# Patient Record
Sex: Male | Born: 1980 | Race: Black or African American | Hispanic: No | Marital: Single | Smoking: Current every day smoker
Health system: Southern US, Community
[De-identification: ages and names within clinical notes are randomized; demographics above are authoritative.]

## PROBLEM LIST (undated history)

## (undated) DIAGNOSIS — F329 Major depressive disorder, single episode, unspecified: Secondary | ICD-10-CM

## (undated) DIAGNOSIS — F32A Depression, unspecified: Secondary | ICD-10-CM

## (undated) DIAGNOSIS — F419 Anxiety disorder, unspecified: Secondary | ICD-10-CM

---

## 1998-02-02 ENCOUNTER — Emergency Department (HOSPITAL_COMMUNITY): Admission: EM | Admit: 1998-02-02 | Discharge: 1998-02-02 | Payer: Self-pay | Admitting: Emergency Medicine

## 2001-03-31 ENCOUNTER — Emergency Department (HOSPITAL_COMMUNITY): Admission: EM | Admit: 2001-03-31 | Discharge: 2001-03-31 | Payer: Self-pay | Admitting: Emergency Medicine

## 2001-05-07 ENCOUNTER — Emergency Department (HOSPITAL_COMMUNITY): Admission: EM | Admit: 2001-05-07 | Discharge: 2001-05-07 | Payer: Self-pay | Admitting: Emergency Medicine

## 2010-02-08 ENCOUNTER — Ambulatory Visit (HOSPITAL_BASED_OUTPATIENT_CLINIC_OR_DEPARTMENT_OTHER): Admission: RE | Admit: 2010-02-08 | Discharge: 2010-02-08 | Payer: Self-pay | Admitting: Orthopedic Surgery

## 2010-03-20 ENCOUNTER — Encounter: Admission: RE | Admit: 2010-03-20 | Discharge: 2010-06-18 | Payer: Self-pay | Admitting: Orthopedic Surgery

## 2010-09-12 ENCOUNTER — Emergency Department (HOSPITAL_COMMUNITY): Admission: EM | Admit: 2010-09-12 | Discharge: 2010-02-01 | Payer: Self-pay | Admitting: Emergency Medicine

## 2010-12-24 LAB — POCT HEMOGLOBIN-HEMACUE: Hemoglobin: 14.4 g/dL (ref 13.0–17.0)

## 2011-01-21 IMAGING — CR DG HAND COMPLETE 3+V*R*
4 series · 4 of 4 positions shown · non-contrast
Comparison: None

CLINICAL DATA: Pain, struck a pole, swelling

RIGHT HAND - COMPLETE 3+ VIEW

[x hand pa right]
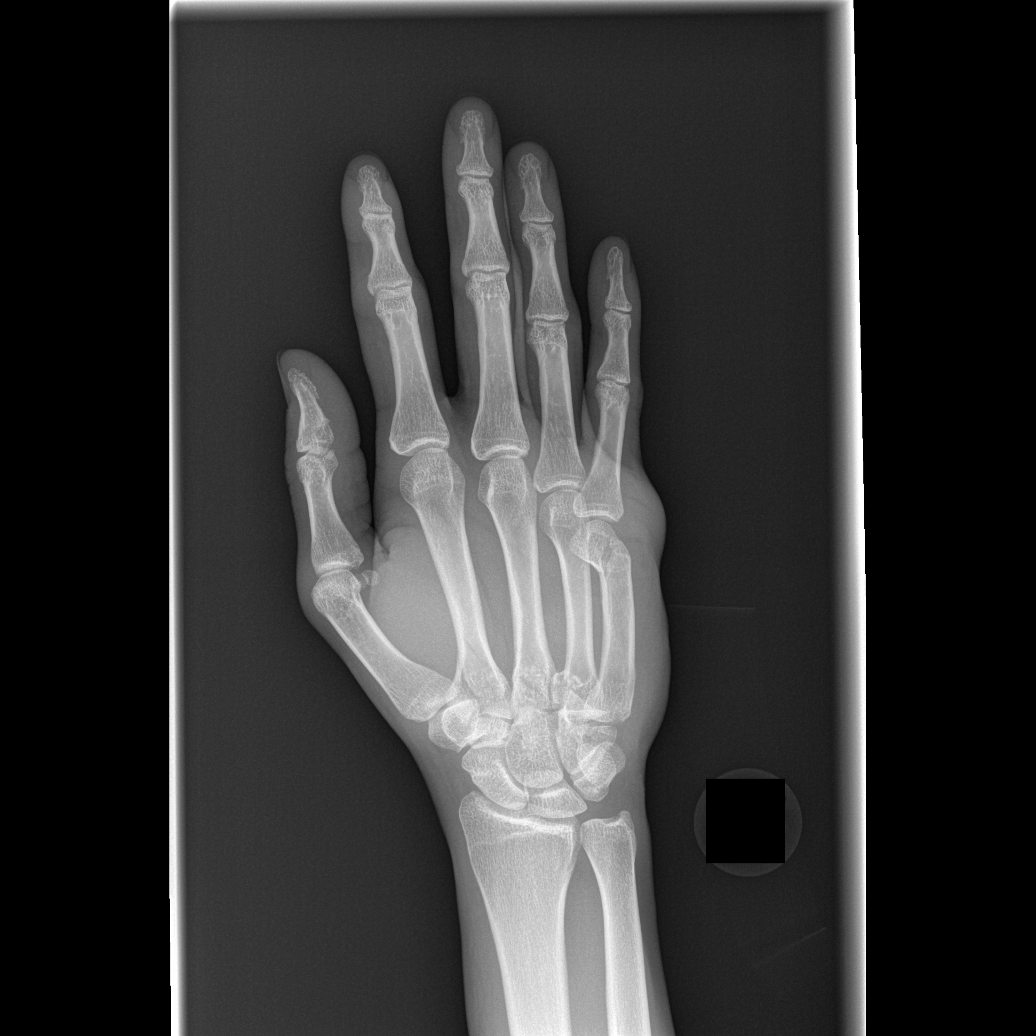

[x hand oblique right]
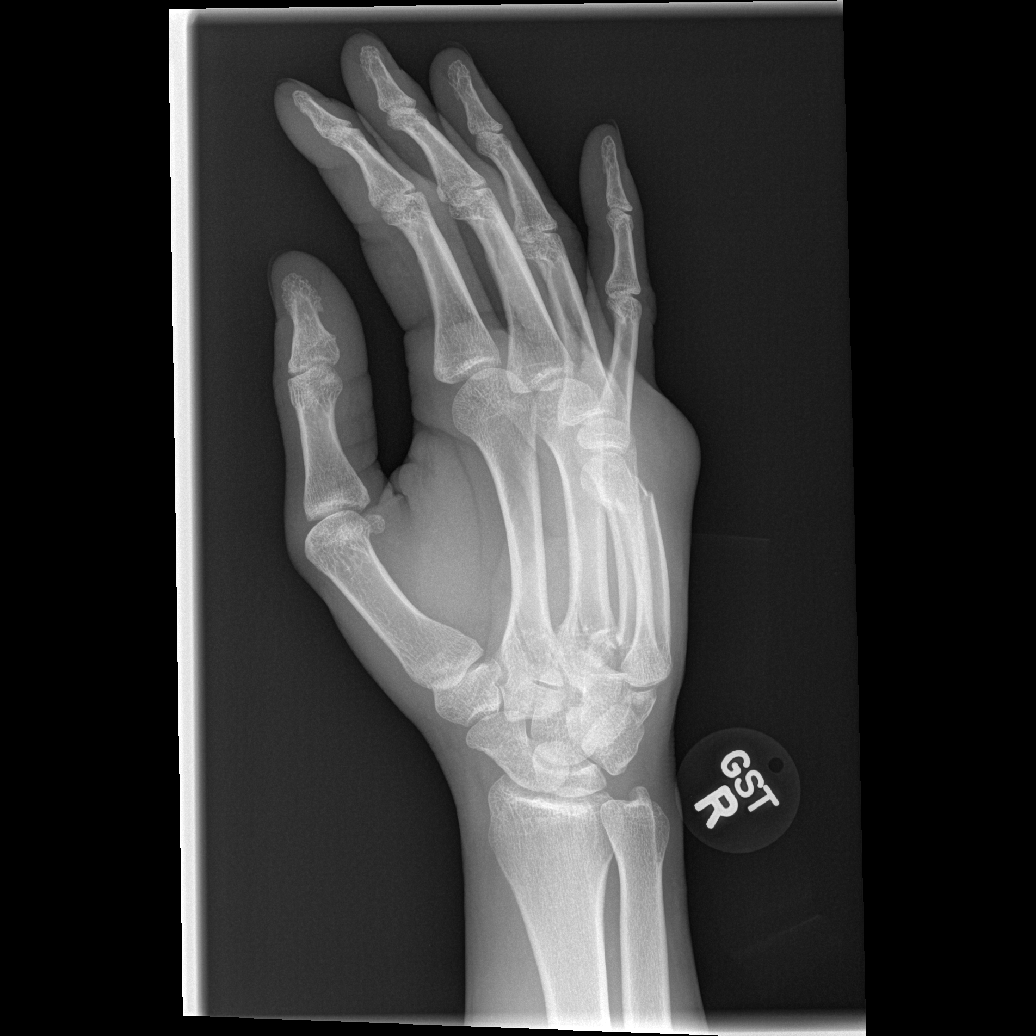

[x hand lat right (1 of 2)]
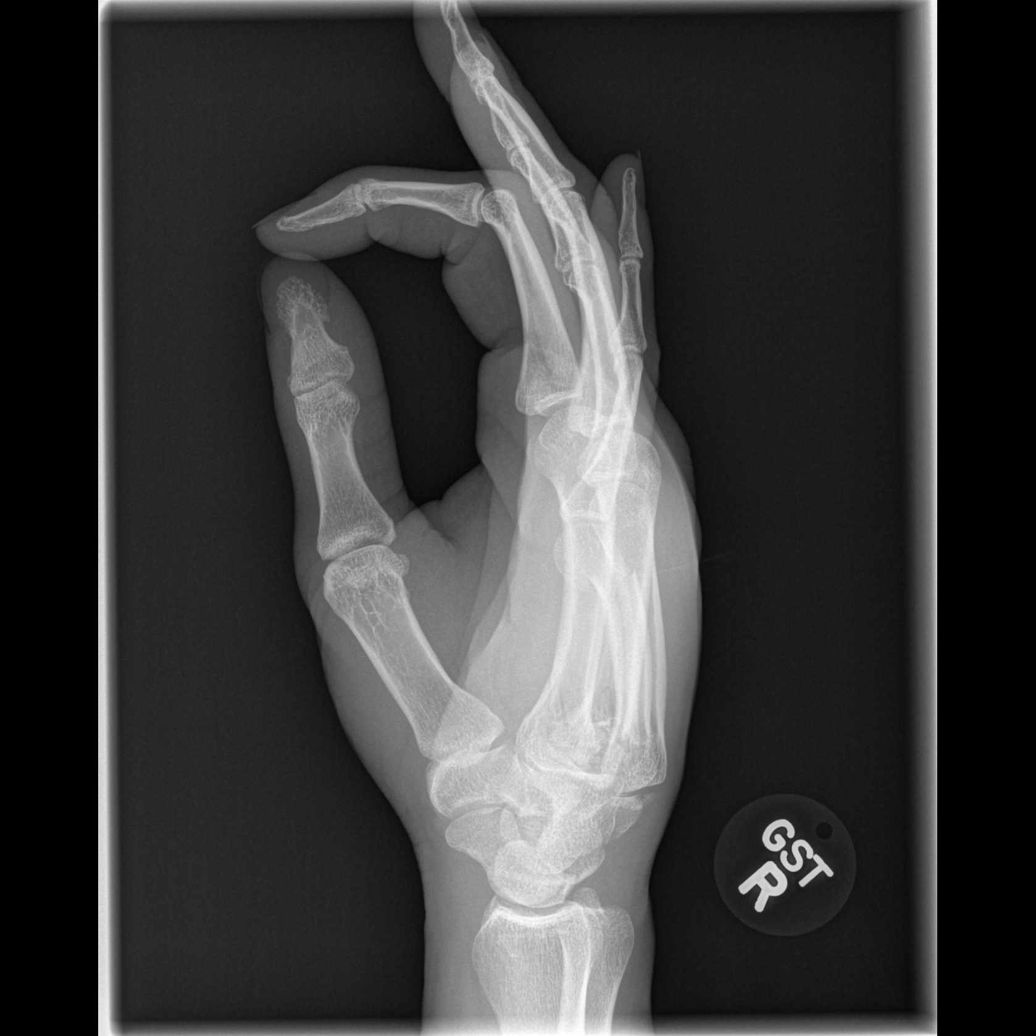

[x hand lat right (2 of 2)]
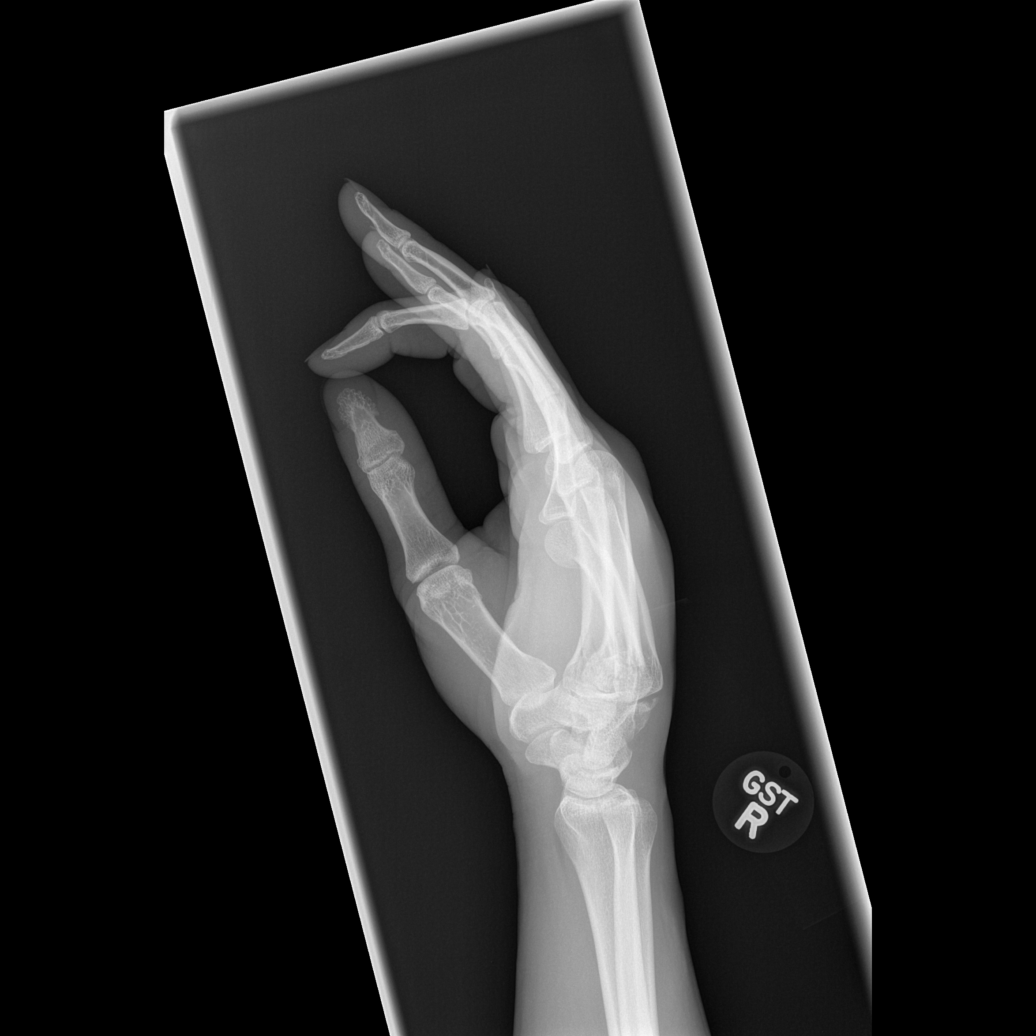

[4 of 4 positions shown; findings below may reference images not displayed]

FINDINGS: Soft tissue swelling right hand, greatest at dorsal and ulnar
margins.
Angulated distal right fifth metacarpal fracture.
Fractures identified at bases of second, third and fourth
metacarpals.
Fractures of the bases of the second and third metacarpals appear
oblique, likely intra-articular, and are not significantly
displaced.
Intra-articular fracture of base of fourth metacarpal with
significant dorsal displacement.
Displaced fracture identified at dorsal ulnar margin of distal
hamate, displaced dorsal and proximal with articular surface
offset.
Remaining joint spaces preserved.
No phalangeal fractures identified.
IMPRESSION: Angulated distal fifth metacarpal fracture.
Essentially nondisplaced fractures at the bases of the second and
third metacarpals, suspect intra-articular at the CMC joints.
Intra-articular fracture at base of the fourth metacarpal with
dorsal displacement.
Dorsal hamate fracture with dorsal displacement of hamate fracture
fragment and fifth CMC joint.

## 2018-01-20 ENCOUNTER — Emergency Department (HOSPITAL_COMMUNITY)
Admission: EM | Admit: 2018-01-20 | Discharge: 2018-01-21 | Disposition: A | Discharge status: Home / Self Care | Payer: Self-pay | Attending: Emergency Medicine | Admitting: Emergency Medicine

## 2018-01-20 ENCOUNTER — Emergency Department (HOSPITAL_COMMUNITY): Payer: Self-pay

## 2018-01-20 ENCOUNTER — Encounter (HOSPITAL_COMMUNITY): Payer: Self-pay

## 2018-01-20 ENCOUNTER — Other Ambulatory Visit: Payer: Self-pay

## 2018-01-20 DIAGNOSIS — R0789 Other chest pain: Secondary | ICD-10-CM | POA: Insufficient documentation

## 2018-01-20 LAB — BASIC METABOLIC PANEL
ANION GAP: 9 (ref 5–15)
BUN: 7 mg/dL (ref 6–20)
CALCIUM: 9.5 mg/dL (ref 8.9–10.3)
CO2: 26 mmol/L (ref 22–32)
Chloride: 101 mmol/L (ref 101–111)
Creatinine, Ser: 1.1 mg/dL (ref 0.61–1.24)
GFR calc non Af Amer: >60 mL/min (ref >60)
Glucose, Bld: 96 mg/dL (ref 65–99)
Potassium: 3.9 mmol/L (ref 3.5–5.1)
SODIUM: 136 mmol/L (ref 135–145)

## 2018-01-20 LAB — CBC
HCT: 43.8 % (ref 39.0–52.0)
HEMOGLOBIN: 14.8 g/dL (ref 13.0–17.0)
MCH: 31.8 pg (ref 26.0–34.0)
MCHC: 33.8 g/dL (ref 30.0–36.0)
MCV: 94.2 fL (ref 78.0–100.0)
Platelets: 271 10*3/uL (ref 150–400)
RBC: 4.65 MIL/uL (ref 4.22–5.81)
RDW: 13.8 % (ref 11.5–15.5)
WBC: 4.8 10*3/uL (ref 4.0–10.5)

## 2018-01-20 LAB — I-STAT TROPONIN, ED
TROPONIN I, POC: 0 ng/mL (ref 0.00–0.08)
Troponin i, poc: 0.01 ng/mL (ref 0.00–0.08)

## 2018-01-20 NOTE — ED Provider Notes (Signed)
[facility] EMERGENCY DEPARTMENT Provider Note   CSN: [id number] Arrival date & time: 01/20/18  1926     History   Chief Complaint Chief Complaint  Patient presents with  . Chest Pain    HPI Austin Doyle is a 37 y.o. male presents today for evaluation of intermittent chest pain for 2 years.  Patient states that he went to [facility] for remedication of his bipolar disorder today and mentioned his chest pain so they sent him here for medical clearance.  He states that he experienced chest pain daily "when I get stressed ".  The pain is nonexertional and nonpleuritic.  Pain is almost always to the left side of the chest and feels like "something sharp and stabbing behind my heart".  He notes shortness of breath with this sometimes but states that it is typically related to when he feels stressed.  He denies diaphoresis, nausea, vomiting, or lightheadedness.  He is a smoker of approximately half a pack of cigarettes daily.  He denies fevers, chills.  No recent travel or surgeries, no hemoptysis, no prior history of DVT or PE although he does tell me that he has had lower extremity edema in the past which required Lasix.  He is not on this medication at this time.  He notes occasional alcohol use and does note cocaine and marijuana use.  No other recreational drug use.  Denies excessive alcohol intake. The history is provided by the patient.    History reviewed. No pertinent past medical history.  There are no active problems to display for this patient.   History reviewed. No pertinent surgical history.      Home Medications    Prior to Admission medications   Not on File    Family History History reviewed. No pertinent family history.  Social History Social History   Tobacco Use  . Smoking status: Never Smoker  Substance Use Topics  . Alcohol use: Never    Frequency: Never  . Drug use: Never     Allergies   Patient has no known allergies.   Review  of Systems Review of Systems  Constitutional: Negative for chills, diaphoresis, fatigue and fever.  Respiratory: Positive for shortness of breath.   Cardiovascular: Positive for chest pain. Negative for palpitations and leg swelling.  Gastrointestinal: Negative for abdominal pain, diarrhea, nausea and vomiting.  All other systems reviewed and are negative.    Physical Exam Updated Vital Signs BP 130/80 (BP Location: Right Arm)   Pulse 84   Temp 98.9 F (37.2 C) (Oral)   Resp 14   SpO2 99%   Physical Exam  Constitutional: He appears well-developed and well-nourished.  Non-toxic appearance. He does not appear ill. No distress.  HENT:  Head: Normocephalic and atraumatic.  Eyes: Conjunctivae are normal. Right eye exhibits no discharge. Left eye exhibits no discharge.  Neck: Normal range of motion. Neck supple. No JVD present. No tracheal deviation present.  Cardiovascular: Normal rate and regular rhythm. Exam reveals no gallop, no S3 and no S4.  No murmur heard. Pulses:      Carotid pulses are 2+ on the right side, and 2+ on the left side.      Radial pulses are 2+ on the right side, and 2+ on the left side.       Dorsalis pedis pulses are 2+ on the right side, and 2+ on the left side.       Posterior tibial pulses are 2+ on the right side,  and 2+ on the left side.  No lower extremity edema, Homans sign absent bilaterally, no palpable cords  Pulmonary/Chest: Effort normal and breath sounds normal. No accessory muscle usage or stridor. No tachypnea. No respiratory distress.  Equal rise and fall of chest, no increased work of breathing, speaking in full sentences without difficulty.  Mild anterior bilateral chest wall pain with no deformity, crepitus, ecchymosis, or flail segment noted.  Abdominal: Soft. Bowel sounds are normal. He exhibits no distension.  Musculoskeletal: Normal range of motion. He exhibits no edema.       Right lower leg: Normal. He exhibits no tenderness and no  edema.       Left lower leg: Normal. He exhibits no tenderness and no edema.  Neurological: He is alert.  Skin: Skin is warm and dry. No erythema.  Psychiatric: He has a normal mood and affect. His behavior is normal.  Nursing note and vitals reviewed.    ED Treatments / Results  Labs (all labs ordered are listed, but only abnormal results are displayed) Labs Reviewed  BASIC METABOLIC PANEL  CBC  I-STAT TROPONIN, ED  I-STAT TROPONIN, ED    EKG EKG Interpretation  Date/Time:  Wednesday January 20 2018 19:30:48 EDT Ventricular Rate:  55 PR Interval:  140 QRS Duration: 84 QT Interval:  422 QTC Calculation: 403 R Axis:   86 Text Interpretation:  Sinus bradycardia Otherwise normal ECG No old tracing to compare Confirmed by Linwood Dibbles [phone]) on 01/21/2018 12:48:51 PM   Radiology Dg Chest 2 View  Result Date: 01/20/2018 CLINICAL DATA:  Chest pain EXAM: CHEST - 2 VIEW COMPARISON:  None. FINDINGS: The heart size and mediastinal contours are within normal limits. Both lungs are clear. The visualized skeletal structures are unremarkable. IMPRESSION: No active cardiopulmonary disease. Electronically Signed   By: Marlan Palau M.D.   On: 01/20/2018 20:18    Procedures Procedures (including critical care time)  Medications Ordered in ED Medications - No data to display   Initial Impression / Assessment and Plan / ED Course  I have reviewed the triage vital signs and the nursing notes.  Pertinent labs & imaging results that were available during my care of the patient were reviewed by me and considered in my medical decision making (see chart for details).     Patient presents for medical clearance of chest pain from [facility].  He is afebrile, vital signs are stable.  Chest pain only occurs when he feels stressed and has been intermittent for 2 years.  History and physical examination does not suggest that symptoms are cardiac in etiology.  EKG shows sinus bradycardia but otherwise  normal with no evidence of ST segment abnormality or ischemia.  Serial troponins are negative and I doubt ACS or MI.  Chest x-ray shows no active cardiopulmonary disease and lungs are clear to auscultation bilaterally.  He has no risk factors for PE.  No evidence of pericarditis, myocarditis, bronchitis, or pneumonia.  Remainder of lab work reviewed by me is entirely unremarkable.  He is medically cleared for reevaluation at [facility].  He denies SI or HI to me.  He is safe for discharge and states he will follow-up with [facility] immediately at this time.  Discussed strict ED return precautions.  Patient verbalized understanding of and agreement with this plan.  Final Clinical Impressions(s) / ED Diagnoses   Final diagnoses:  Atypical chest pain    ED Discharge Orders    None       Edom, [place]  A, PA-C 01/21/18 1407    Linwood DibblesKnapp, Jon, MD 01/23/18 [phone]

## 2018-01-20 NOTE — Discharge Instructions (Signed)
[facility]  Go there as soon as possible.  Return to the ED for any concerning signs or symptoms develop.

## 2018-01-20 NOTE — ED Triage Notes (Signed)
Per [facility], pt complains of CP x 2 years. VSS. [facility] and they made him come here to get medically cleared first.

## 2018-06-14 ENCOUNTER — Other Ambulatory Visit: Payer: Self-pay

## 2018-06-14 ENCOUNTER — Emergency Department (HOSPITAL_COMMUNITY)
Admission: EM | Admit: 2018-06-14 | Discharge: 2018-06-15 | Disposition: A | Discharge status: Other Acute Inpatient Hospital | Payer: Self-pay | Attending: Emergency Medicine | Admitting: Emergency Medicine

## 2018-06-14 ENCOUNTER — Encounter (HOSPITAL_COMMUNITY): Payer: Self-pay | Admitting: *Deleted

## 2018-06-14 DIAGNOSIS — R51 Headache: Secondary | ICD-10-CM | POA: Insufficient documentation

## 2018-06-14 DIAGNOSIS — F322 Major depressive disorder, single episode, severe without psychotic features: Secondary | ICD-10-CM | POA: Insufficient documentation

## 2018-06-14 DIAGNOSIS — F319 Bipolar disorder, unspecified: Secondary | ICD-10-CM | POA: Insufficient documentation

## 2018-06-14 DIAGNOSIS — F332 Major depressive disorder, recurrent severe without psychotic features: Secondary | ICD-10-CM

## 2018-06-14 DIAGNOSIS — R45851 Suicidal ideations: Secondary | ICD-10-CM | POA: Insufficient documentation

## 2018-06-14 DIAGNOSIS — R519 Headache, unspecified: Secondary | ICD-10-CM

## 2018-06-14 LAB — COMPREHENSIVE METABOLIC PANEL
ALT: 20 U/L (ref 0–44)
ANION GAP: 6 (ref 5–15)
AST: 26 U/L (ref 15–41)
Albumin: 3.7 g/dL (ref 3.5–5.0)
Alkaline Phosphatase: 48 U/L (ref 38–126)
BILIRUBIN TOTAL: 0.9 mg/dL (ref 0.3–1.2)
BUN: 8 mg/dL (ref 6–20)
CHLORIDE: 103 mmol/L (ref 98–111)
CO2: 30 mmol/L (ref 22–32)
Calcium: 9.4 mg/dL (ref 8.9–10.3)
Creatinine, Ser: 1.11 mg/dL (ref 0.61–1.24)
Glucose, Bld: 122 mg/dL — ABNORMAL HIGH (ref 70–99)
Potassium: 4.7 mmol/L (ref 3.5–5.1)
SODIUM: 139 mmol/L (ref 135–145)
Total Protein: 7 g/dL (ref 6.5–8.1)

## 2018-06-14 LAB — CBC
HCT: 45 % (ref 39.0–52.0)
HEMOGLOBIN: 14.9 g/dL (ref 13.0–17.0)
MCH: 31.2 pg (ref 26.0–34.0)
MCHC: 33.1 g/dL (ref 30.0–36.0)
MCV: 94.3 fL (ref 78.0–100.0)
Platelets: 276 10*3/uL (ref 150–400)
RBC: 4.77 MIL/uL (ref 4.22–5.81)
RDW: 12.8 % (ref 11.5–15.5)
WBC: 3.4 10*3/uL — ABNORMAL LOW (ref 4.0–10.5)

## 2018-06-14 LAB — ETHANOL: Alcohol, Ethyl (B): <10 mg/dL (ref <10)

## 2018-06-14 LAB — ACETAMINOPHEN LEVEL

## 2018-06-14 LAB — SALICYLATE LEVEL

## 2018-06-14 MED ORDER — DIPHENHYDRAMINE HCL 25 MG PO CAPS
50.0000 mg | ORAL_CAPSULE | Freq: Once | ORAL | Status: AC
  Administered 2018-06-14: 50 mg via ORAL
  Filled 2018-06-14: qty 2

## 2018-06-14 MED ORDER — SODIUM CHLORIDE 0.9 % IV BOLUS
1000.0000 mL | Freq: Once | INTRAVENOUS | Status: AC
  Administered 2018-06-14: 1000 mL via INTRAVENOUS

## 2018-06-14 MED ORDER — KETOROLAC TROMETHAMINE 15 MG/ML IJ SOLN
15.0000 mg | Freq: Once | INTRAMUSCULAR | Status: AC
  Administered 2018-06-14: 15 mg via INTRAVENOUS
  Filled 2018-06-14: qty 1

## 2018-06-14 MED ORDER — PROMETHAZINE HCL 25 MG/ML IJ SOLN
25.0000 mg | Freq: Once | INTRAMUSCULAR | Status: AC
  Administered 2018-06-14: 25 mg via INTRAVENOUS
  Filled 2018-06-14: qty 1

## 2018-06-14 NOTE — BH Assessment (Signed)
Tele Assessment Note   Patient Name: Austin Doyle MRN: [id number] Referring Physician: Dr. Jeraldine Loots Location of Patient: [facility] Location of Provider: Behavioral Health TTS Department  Deivi Olshan is an 37 y.o. male presenting with suicidal ideations with plan to run into traffic get hit and die. Patient reported to [facility] today and shared his plan to die. [facility] called the police. Patient was escorted to ED by police. Patient stated he is tired of living like this. Patient reported seeing [facility] for 3 years. Patient reported no longer on Depakote which he feels is why his is having headaches. Depressive symptoms, patient reported crying spells, irritable and anxiety. Patient history bipolar, depression, anxiety and ADHD.   Patient cooperative during assessment. Patient alert and oriented x4. Patient mood was depressed helpless and sad. Affect congruent to mood.   Disposition: Nira Conn, NP, patient meets inpatient criteria. TTS to secure placement. Doctor and RN notified of disposition.  Diagnosis: Major Depressive Disorder, Bipolar, Anxiety  Past Medical History: History reviewed. No pertinent past medical history.  History reviewed. No pertinent surgical history.  Family History: History reviewed. No pertinent family history.  Social History:  reports that he has never smoked. He does not have any smokeless tobacco history on file. He reports that he does not drink alcohol or use drugs.  Additional Social History:  Alcohol / Drug Use Pain Medications: see MAR Prescriptions: see MAR Over the Counter: see MAR  CIWA: CIWA-Ar BP: (!) 124/91 Pulse Rate: 63 COWS:    Allergies: No Known Allergies  Home Medications:  (Not in a hospital admission)  OB/GYN Status:  No LMP for male patient.  General Assessment Data Assessment unable to be completed: (n/a) Location of Assessment: [facility] ED TTS Assessment: In system Is this a Tele or Face-to-Face Assessment?: Tele Assessment Is  this an Initial Assessment or a Re-assessment for this encounter?: Initial Assessment Patient Accompanied by:: N/A Language Other than English: No Living Arrangements: Homeless/Shelter What gender do you identify as?: Male Marital status: Single Pregnancy Status: No Living Arrangements: (shelter) Can pt return to current living arrangement?: Yes Admission Status: Voluntary Is patient capable of signing voluntary admission?: Yes Referral Source: Museum/gallery curator)     Crisis Care Plan Living Arrangements: (shelter) Legal Guardian: (self) Name of Psychiatrist: Vesta Mixer) Name of Therapist: Vesta Mixer)  Education Status Is patient currently in school?: No Is the patient employed, unemployed or receiving disability?: Unemployed  Risk to self with the past 6 months Suicidal Ideation: Yes-Currently Present Has patient been a risk to self within the past 6 months prior to admission? : No Suicidal Intent: Yes-Currently Present Has patient had any suicidal intent within the past 6 months prior to admission? : No Is patient at risk for suicide?: Yes Suicidal Plan?: Yes-Currently Present Has patient had any suicidal plan within the past 6 months prior to admission? : No Specify Current Suicidal Plan: (walk into traffic) Access to Means: Yes Specify Access to Suicidal Means: (walk into traffic) What has been your use of drugs/alcohol within the last 12 months?: (none) Previous Attempts/Gestures: No How many times?: (0) Other Self Harm Risks: (none) Intentional Self Injurious Behavior: None Family Suicide History: Unknown Recent stressful life event(s): (family life) Persecutory voices/beliefs?: No Depression: Yes Depression Symptoms: Insomnia, Tearfulness, Isolating, Fatigue, Guilt, Loss of interest in usual pleasures, Feeling worthless/self pity, Feeling angry/irritable Substance abuse history and/or treatment for substance abuse?: No  Risk to Others within the past 6 months Homicidal  Ideation: No Does patient have any lifetime risk of violence  toward others beyond the six months prior to admission? : No Thoughts of Harm to Others: Yes-Currently Present Comment - Thoughts of Harm to Others: (would not dislose) Current Homicidal Intent: No Current Homicidal Plan: No Identified Victim: (no) History of harm to others?: No Assessment of Violence: None Noted Violent Behavior Description: (none) Does patient have access to weapons?: No Criminal Charges Pending?: No Does patient have a court date: No Is patient on probation?: No  Psychosis Hallucinations: None noted Delusions: None noted  Mental Status Report Appearance/Hygiene: Unremarkable(well cared for) Eye Contact: Poor Motor Activity: Unremarkable, Freedom of movement Speech: Logical/coherent, Soft Level of Consciousness: Alert Mood: Depressed, Helpless, Sad Affect: Depressed, Sad Anxiety Level: Minimal Thought Processes: Coherent Judgement: Impaired Orientation: Person, Place, Time, Situation, Appropriate for developmental age Obsessive Compulsive Thoughts/Behaviors: None  Cognitive Functioning Concentration: Fair Memory: Recent Intact Is patient IDD: No Insight: Fair Impulse Control: Fair Appetite: Poor Have you had any weight changes? : No Change Sleep: Decreased(1 hour) Total Hours of Sleep: (1 hour) Vegetative Symptoms: None  ADLScreening [facility] Assessment Services) Patient's cognitive ability adequate to safely complete daily activities?: Yes Patient able to express need for assistance with ADLs?: Yes Independently performs ADLs?: Yes (appropriate for developmental age)  Prior Inpatient Therapy Prior Inpatient Therapy: Yes Prior Therapy Dates: (2 months ago) Reason for Treatment: (suicidal thoughts)  Prior Outpatient Therapy Prior Outpatient Therapy: Yes Prior Therapy Dates: (today) Prior Therapy Facilty/Provider(s): Museum/gallery curator) Reason for Treatment: (suicidal thoughts) Does patient  have an ACCT team?: No Does patient have Intensive In-House Services?  : No Does patient have [facility] services? : Yes Does patient have P4[facility] services?: No  ADL Screening (condition at time of admission) Patient's cognitive ability adequate to safely complete daily activities?: Yes Patient able to express need for assistance with ADLs?: Yes Independently performs ADLs?: Yes (appropriate for developmental age)                        Disposition:  Disposition Initial Assessment Completed for this Encounter: Yes Disposition of Patient: (inpatient treatment) Type of inpatient treatment program: Adult Mode of transportation if patient is discharged?: (unknown)  Nira Conn, NP, patient meets inpatient criteria. TTS to secure placement. Doctor and RN notified of disposition.  This service was provided via telemedicine using a 2-way, interactive audio and video technology.  Names of all persons participating in this telemedicine service and their role in this encounter. Name: [name] Role: patient  Name: Al Corpus, [facility] Role: TTS Clinician  Name:  Role:   Name:  Role:     Burnetta Sabin 06/14/2018 10:07 PM

## 2018-06-14 NOTE — ED Provider Notes (Signed)
[facility] EMERGENCY DEPARTMENT Provider Note   CSN: [id number] Arrival date & time: 06/14/18  1038     History   Chief Complaint Chief Complaint  Patient presents with  . Suicidal  . Headache    HPI Austin Doyle is a 37 y.o. male who presents today for evaluation of suicidal ideations.  He was brought here by [employer] after he was in the road dodging cars.  He tells me that this was because he hoped to get hit by a car and killed.  He says that he has been homeless for the past 2 years and is tired of it all and wishes to die.  He denies HI or AVH.    He also reports migraine headache.  His head has been hurting him for over a year, and he attributes this to being taken off Depakote.  He denies any fevers, no nausea or vomiting.  HPI  History reviewed. No pertinent past medical history.  There are no active problems to display for this patient.   History reviewed. No pertinent surgical history.      Home Medications    Prior to Admission medications   Not on File    Family History History reviewed. No pertinent family history.  Social History Social History   Tobacco Use  . Smoking status: Never Smoker  Substance Use Topics  . Alcohol use: Never    Frequency: Never  . Drug use: Never     Allergies   Patient has no known allergies.   Review of Systems Review of Systems  Constitutional: Negative for chills and fever.  Neurological: Positive for headaches. Negative for seizures, syncope, weakness and numbness.  Psychiatric/Behavioral: Positive for behavioral problems, dysphoric mood and suicidal ideas. Negative for sleep disturbance. The patient is not nervous/anxious.   All other systems reviewed and are negative.    Physical Exam Updated Vital Signs BP (!) 124/91   Pulse 63   Temp 97.9 F (36.6 C) (Oral)   Resp 18   SpO2 100%   Physical Exam  Constitutional: He is oriented to person, place, and time. He appears well-developed  and well-nourished. No distress.  HENT:  Head: Normocephalic and atraumatic.  Eyes: Conjunctivae are normal. Right eye exhibits no discharge. Left eye exhibits no discharge. No scleral icterus.  Neck: Normal range of motion.  Cardiovascular: Normal rate and regular rhythm.  Pulmonary/Chest: Effort normal. No stridor. No respiratory distress.  Abdominal: He exhibits no distension.  Musculoskeletal: He exhibits no edema or deformity.  Neurological: He is alert and oriented to person, place, and time. He has normal strength. He is not disoriented. No cranial nerve deficit. He exhibits normal muscle tone. Gait normal. GCS eye subscore is 4. GCS verbal subscore is 5. GCS motor subscore is 6.  Skin: Skin is warm and dry. He is not diaphoretic.  Psychiatric: His speech is normal. His affect is blunt. He is slowed and withdrawn. He expresses impulsivity. He expresses suicidal ideation. He expresses no homicidal ideation. He expresses suicidal plans. He expresses no homicidal plans.  Nursing note and vitals reviewed.    ED Treatments / Results  Labs (all labs ordered are listed, but only abnormal results are displayed) Labs Reviewed  COMPREHENSIVE METABOLIC PANEL - Abnormal; Notable for the following components:      Result Value   Glucose, Bld 122 (*)    All other components within normal limits  ACETAMINOPHEN LEVEL - Abnormal; Notable for the following components:   Acetaminophen (  Tylenol), Serum <10 (*)    All other components within normal limits  CBC - Abnormal; Notable for the following components:   WBC 3.4 (*)    All other components within normal limits  ETHANOL  SALICYLATE LEVEL  RAPID URINE DRUG SCREEN, HOSP PERFORMED    EKG EKG Interpretation  Date/Time:  Monday June 14 2018 18:58:07 EDT Ventricular Rate:  60 PR Interval:    QRS Duration: 86 QT Interval:  433 QTC Calculation: 433 R Axis:   67 Text Interpretation:  Sinus rhythm ST elevation suggests acute  pericarditis No significant change since last tracing Confirmed by Gwyneth Sprout ([id number]) on 06/14/2018 7:07:42 PM   Radiology No results found.  Procedures Procedures (including critical care time)  Medications Ordered in ED Medications  ketorolac (TORADOL) 15 MG/ML injection 15 mg (15 mg Intravenous Given 06/14/18 1923)  diphenhydrAMINE (BENADRYL) capsule 50 mg (50 mg Oral Given 06/14/18 1922)  promethazine (PHENERGAN) injection 25 mg (25 mg Intravenous Given 06/14/18 1925)  sodium chloride 0.9 % bolus 1,000 mL (0 mLs Intravenous Stopped 06/14/18 2026)     Initial Impression / Assessment and Plan / ED Course  I have reviewed the triage vital signs and the nursing notes.  Pertinent labs & imaging results that were available during my care of the patient were reviewed by me and considered in my medical decision making (see chart for details).    Patient presents today for evaluation of suicidal ideation after being found by [employer] attempting to get himself hit by a vehicle.  He is homeless and reports that he is tired of it all and wants to die.  He complained of a headache, has a history of migraine headaches and has had these since childhood.  He denies any changes to his headaches, says that it is not concerning for anything abnormal he just wants his head to stop hurting.  He was treated with Phenergan, Benadryl, Toradol, and IV fluids after which he reported resolution of his headache.  He is afebrile.  Patient is currently here voluntarily, however if he attempts to leave prior to TTS evaluation will consider IVC as he was attempting to kill himself by getting hit by a car prior to arrival.  At this time patient is without any physical complaints or concerns, medically clear for psychiatric placement and disposition.  Final Clinical Impressions(s) / ED Diagnoses   Final diagnoses:  Suicidal ideation  Bad headache    ED Discharge Orders    None       Norman Clay 06/14/18 2110    Gwyneth Sprout, MD 06/15/18 1410

## 2018-06-14 NOTE — ED Triage Notes (Signed)
Pt was given orange juice, water, sandwich,and graham crackers.

## 2018-06-14 NOTE — ED Triage Notes (Signed)
Pt is here with [employer]. Reports being suicidal and running in front of traffic this am and also has a headache. Denies any alcohol or drug use.

## 2018-06-14 NOTE — ED Notes (Signed)
Pt called x 3 with no answer in lobby

## 2018-06-14 NOTE — ED Notes (Signed)
Belongings placed in locker #3.  

## 2018-06-14 NOTE — ED Notes (Signed)
Pt ate all of his dinner tray

## 2018-06-14 NOTE — ED Notes (Signed)
Pt was called for vitals to be re-checked. No response. 

## 2018-06-14 NOTE — ED Notes (Signed)
TTS in process-Monique,RN  

## 2018-06-14 NOTE — ED Notes (Signed)
Dinner tray ordered.

## 2018-06-15 ENCOUNTER — Encounter (HOSPITAL_COMMUNITY): Payer: Self-pay | Admitting: *Deleted

## 2018-06-15 ENCOUNTER — Other Ambulatory Visit: Payer: Self-pay

## 2018-06-15 ENCOUNTER — Inpatient Hospital Stay (HOSPITAL_COMMUNITY)
Admission: AD | Admit: 2018-06-15 | Discharge: 2018-06-22 | DRG: 885 | Disposition: A | Discharge status: Home / Self Care | Payer: Federal, State, Local not specified - Other | Source: Intra-hospital | Attending: Psychiatry | Admitting: Psychiatry

## 2018-06-15 ENCOUNTER — Encounter (HOSPITAL_COMMUNITY): Payer: Self-pay | Admitting: Registered Nurse

## 2018-06-15 DIAGNOSIS — F419 Anxiety disorder, unspecified: Secondary | ICD-10-CM | POA: Diagnosis present

## 2018-06-15 DIAGNOSIS — Z23 Encounter for immunization: Secondary | ICD-10-CM | POA: Diagnosis not present

## 2018-06-15 DIAGNOSIS — Z818 Family history of other mental and behavioral disorders: Secondary | ICD-10-CM | POA: Diagnosis not present

## 2018-06-15 DIAGNOSIS — F1490 Cocaine use, unspecified, uncomplicated: Secondary | ICD-10-CM | POA: Diagnosis present

## 2018-06-15 DIAGNOSIS — F332 Major depressive disorder, recurrent severe without psychotic features: Principal | ICD-10-CM | POA: Diagnosis present

## 2018-06-15 DIAGNOSIS — R45851 Suicidal ideations: Secondary | ICD-10-CM | POA: Diagnosis present

## 2018-06-15 DIAGNOSIS — G43909 Migraine, unspecified, not intractable, without status migrainosus: Secondary | ICD-10-CM | POA: Diagnosis present

## 2018-06-15 DIAGNOSIS — F17210 Nicotine dependence, cigarettes, uncomplicated: Secondary | ICD-10-CM | POA: Diagnosis present

## 2018-06-15 DIAGNOSIS — F1420 Cocaine dependence, uncomplicated: Secondary | ICD-10-CM | POA: Diagnosis not present

## 2018-06-15 DIAGNOSIS — Z9114 Patient's other noncompliance with medication regimen: Secondary | ICD-10-CM | POA: Diagnosis not present

## 2018-06-15 DIAGNOSIS — G4700 Insomnia, unspecified: Secondary | ICD-10-CM | POA: Diagnosis present

## 2018-06-15 DIAGNOSIS — Z590 Homelessness: Secondary | ICD-10-CM

## 2018-06-15 DIAGNOSIS — F3160 Bipolar disorder, current episode mixed, unspecified: Secondary | ICD-10-CM | POA: Diagnosis present

## 2018-06-15 HISTORY — DX: Anxiety disorder, unspecified: F41.9

## 2018-06-15 HISTORY — DX: Major depressive disorder, single episode, unspecified: F32.9

## 2018-06-15 HISTORY — DX: Depression, unspecified: F32.A

## 2018-06-15 LAB — RAPID URINE DRUG SCREEN, HOSP PERFORMED
AMPHETAMINES: NOT DETECTED
BENZODIAZEPINES: NOT DETECTED
Barbiturates: NOT DETECTED
Cocaine: POSITIVE — AB
OPIATES: NOT DETECTED
Tetrahydrocannabinol: NOT DETECTED

## 2018-06-15 MED ORDER — PNEUMOCOCCAL VAC POLYVALENT 25 MCG/0.5ML IJ INJ
0.5000 mL | INJECTION | INTRAMUSCULAR | Status: AC
  Administered 2018-06-17: 0.5 mL via INTRAMUSCULAR

## 2018-06-15 MED ORDER — ACETAMINOPHEN 325 MG PO TABS
650.0000 mg | ORAL_TABLET | Freq: Four times a day (QID) | ORAL | Status: DC | PRN
  Administered 2018-06-15 – 2018-06-18 (×3): 650 mg via ORAL
  Filled 2018-06-15 (×3): qty 2

## 2018-06-15 MED ORDER — TRAZODONE HCL 50 MG PO TABS
50.0000 mg | ORAL_TABLET | Freq: Every evening | ORAL | Status: DC | PRN
  Administered 2018-06-15: 50 mg via ORAL
  Filled 2018-06-15 (×5): qty 1

## 2018-06-15 MED ORDER — HYDROXYZINE HCL 25 MG PO TABS
25.0000 mg | ORAL_TABLET | Freq: Three times a day (TID) | ORAL | Status: DC | PRN
  Administered 2018-06-15: 25 mg via ORAL
  Filled 2018-06-15: qty 1

## 2018-06-15 MED ORDER — INFLUENZA VAC SPLIT QUAD 0.5 ML IM SUSY
0.5000 mL | PREFILLED_SYRINGE | INTRAMUSCULAR | Status: AC
  Administered 2018-06-17: 0.5 mL via INTRAMUSCULAR
  Filled 2018-06-15: qty 0.5

## 2018-06-15 NOTE — Tx Team (Signed)
Initial Treatment Plan 06/15/2018 6:00 PM Austin Doyle [id number]    PATIENT STRESSORS: Financial difficulties Medication change or noncompliance Substance abuse   PATIENT STRENGTHS: Ability for insight Average or above average intelligence Capable of independent living General fund of knowledge Motivation for treatment/growth   PATIENT IDENTIFIED PROBLEMS: Depression Suicidal thoughts Substance Abuse "I would like to get back on my medicine"                     DISCHARGE CRITERIA:  Ability to meet basic life and health needs Improved stabilization in mood, thinking, and/or behavior Verbal commitment to aftercare and medication compliance  PRELIMINARY DISCHARGE PLAN: Attend aftercare/continuing care group  PATIENT/FAMILY INVOLVEMENT: This treatment plan has been presented to and reviewed with the patient, Austin Doyle, and/or family member, .  The patient and family have been given the opportunity to ask questions and make suggestions.  Austin Doyle, [place], [place] 06/15/2018, 6:00 PM

## 2018-06-15 NOTE — ED Notes (Signed)
ALL belongings - 2 labeled belongings bags and 1 valuables envelope - [facility] - Pt aware. Pt has his eyeglasses.

## 2018-06-15 NOTE — Progress Notes (Signed)
Pt accepted to [facility], Bed 306-2  Shuvon Rankin, NP, is the accepting provider.  Dr. Landry Mellow, MD, is the attending provider.  Call report to [phone]  [facility] ED notified.   Pt is Voluntary.  Pt may be transported by [facility]  Pt scheduled  to arrive at [facility] when [facility] contacts ED that bed is available.  Timmothy Euler. Kaylyn Lim, MSW, LCSWA Disposition Clinical Social Work [phone] (cell) [phone] (office)

## 2018-06-15 NOTE — ED Notes (Signed)
Pt voiced understanding and agreement w/tx plan - accepted to [facility] - signed consent form - copy faxed to [facility], copy sent to Medical Records, and original placed in envelope for [employer]

## 2018-06-15 NOTE — ED Notes (Signed)
Telepsych being performed. 

## 2018-06-15 NOTE — Consult Note (Signed)
Telepsych Consultation   Reason for Consult:  Suicidal ideation Referring Physician:  Ollen Gross Location of Patient: [facility] Location of Provider: [facility]  Patient Identification: Austin Doyle MRN:  [id number] Principal Diagnosis: MDD (major depressive disorder), recurrent severe, without psychosis (Austin Doyle) Diagnosis:   Patient Active Problem List   Diagnosis Date Noted  . MDD (major depressive disorder), recurrent severe, without psychosis ([place]) [F33.2] 06/15/2018    Total Time spent with patient: 30 minutes  Subjective:   Austin Doyle is a 37 y.o. male patient present to [facility] via law enforcement with complaints of suicidal ideation after reporting at [facility] that he was going to run into traffic and get hit by a car.   HPI:  Austin Doyle, 37 y.o., male patient seen via telepsych by this provider; chart reviewed and consulted with Dr. Dwyane Dee on 06/15/18.  On evaluation Equan Cogbill reports he came to the hospital because he was having suicidal thoughts.  "I just gave up; tired of living.  I have a lot of death, things not going right, family. Pain."  Patient states that he has been off of his medication greater than 8 months since he lost his medicaid.  States that he was on Depakote and other medications that he can't remember the name of.  States when taking medications it was helping his headache.  Patient reports that he is homeless, unemployed and has no support.  Patient continues to endorse suicidal ideation and unable to contract for safety     Past Psychiatric History: Patient reports prior inpatient psychiatric treatment related to attempted suicide "I had a gun was gonna shoot myself."  Prior outpatient services with [facility] and psychotropic medications but only remembers Depakote but has been off greater than 8 months.  States he has a prior diagnosis of Bipolar Disorder  Risk to Self: Suicidal Ideation: Yes-Currently Present Suicidal Intent:  Yes-Currently Present Is patient at risk for suicide?: Yes Suicidal Plan?: Yes-Currently Present Specify Current Suicidal Plan: (walk into traffic) Access to Means: Yes Specify Access to Suicidal Means: (walk into traffic) What has been your use of drugs/alcohol within the last 12 months?: (none) How many times?: (0) Other Self Harm Risks: (none) Intentional Self Injurious Behavior: None Risk to Others: Homicidal Ideation: No Thoughts of Harm to Others: Yes-Currently Present Comment - Thoughts of Harm to Others: (would not dislose) Current Homicidal Intent: No Current Homicidal Plan: No Identified Victim: (no) History of harm to others?: No Assessment of Violence: None Noted Violent Behavior Description: (none) Does patient have access to weapons?: No Criminal Charges Pending?: No Does patient have a court date: No Prior Inpatient Therapy: Prior Inpatient Therapy: Yes Prior Therapy Dates: (2 months ago) Reason for Treatment: (suicidal thoughts) Prior Outpatient Therapy: Prior Outpatient Therapy: Yes Prior Therapy Dates: (today) Prior Therapy Facilty/Provider(s): Consulting civil engineer) Reason for Treatment: (suicidal thoughts) Does patient have an ACCT team?: No Does patient have Intensive In-House Services?  : No Does patient have [facility] services? : Yes Does patient have P4CC services?: No  Past Medical History: History reviewed. No pertinent past medical history. History reviewed. No pertinent surgical history. Family History: History reviewed. No pertinent family history. Family Psychiatric  History: Unaware Social History:  Social History   Substance and Sexual Activity  Alcohol Use Never  . Frequency: Never     Social History   Substance and Sexual Activity  Drug Use Never    Social History   Socioeconomic History  . Marital status: Single    Spouse  name: Not on file  . Number of children: Not on file  . Years of education: Not on file  . Highest education level: Not  on file  Occupational History  . Not on file  Social Needs  . Financial resource strain: Not on file  . Food insecurity:    Worry: Not on file    Inability: Not on file  . Transportation needs:    Medical: Not on file    Non-medical: Not on file  Tobacco Use  . Smoking status: Never Smoker  Substance and Sexual Activity  . Alcohol use: Never    Frequency: Never  . Drug use: Never  . Sexual activity: Not on file  Lifestyle  . Physical activity:    Days per week: Not on file    Minutes per session: Not on file  . Stress: Not on file  Relationships  . Social connections:    Talks on phone: Not on file    Gets together: Not on file    Attends religious service: Not on file    Active member of club or organization: Not on file    Attends meetings of clubs or organizations: Not on file    Relationship status: Not on file  Other Topics Concern  . Not on file  Social History Narrative  . Not on file   Additional Social History:    Allergies:  No Known Allergies  Labs:  Results for orders placed or performed during the hospital encounter of 06/14/18 (from the past 48 hour(s))  Rapid urine drug screen (hospital performed)     Status: Abnormal   Collection Time: 06/14/18  8:20 AM  Result Value Ref Range   Opiates NONE DETECTED NONE DETECTED   Cocaine POSITIVE (A) NONE DETECTED   Benzodiazepines NONE DETECTED NONE DETECTED   Amphetamines NONE DETECTED NONE DETECTED   Tetrahydrocannabinol NONE DETECTED NONE DETECTED   Barbiturates NONE DETECTED NONE DETECTED    Comment: (NOTE) DRUG SCREEN FOR MEDICAL PURPOSES ONLY.  IF CONFIRMATION IS NEEDED FOR ANY PURPOSE, NOTIFY LAB WITHIN 5 DAYS. LOWEST DETECTABLE LIMITS FOR URINE DRUG SCREEN Drug Class                     Cutoff (ng/mL) Amphetamine and metabolites    1000 Barbiturate and metabolites    200 Benzodiazepine                 786 Tricyclics and metabolites     300 Opiates and metabolites        300 Cocaine and  metabolites        300 THC                            50 Performed at [facility], [place] [address] [place], [place] [address]   Comprehensive metabolic panel     Status: Abnormal   Collection Time: 06/14/18  3:35 PM  Result Value Ref Range   Sodium 139 135 - 145 mmol/L   Potassium 4.7 3.5 - 5.1 mmol/L   Chloride 103 98 - 111 mmol/L   CO2 30 22 - 32 mmol/L   Glucose, Bld 122 (H) 70 - 99 mg/dL   BUN 8 6 - 20 mg/dL   Creatinine, Ser 1.11 0.61 - 1.24 mg/dL   Calcium 9.4 8.9 - 10.3 mg/dL   Total Protein 7.0 6.5 - 8.1 g/dL   Albumin 3.7 3.5 - 5.0  g/dL   AST 26 15 - 41 U/L   ALT 20 0 - 44 U/L   Alkaline Phosphatase 48 38 - 126 U/L   Total Bilirubin 0.9 0.3 - 1.2 mg/dL   GFR calc non Af Amer >60 >60 mL/min   GFR calc Af Amer >60 >60 mL/min    Comment: (NOTE) The eGFR has been calculated using the CKD EPI equation. This calculation has not been validated in all clinical situations. eGFR's persistently <60 mL/min signify possible Chronic Kidney Disease.    Anion gap 6 5 - 15    Comment: Performed at [facility] [address] [place], [place] [address]  Ethanol     Status: None   Collection Time: 06/14/18  3:35 PM  Result Value Ref Range   Alcohol, Ethyl (B) <10 <10 mg/dL    Comment: (NOTE) Lowest detectable limit for serum alcohol is 10 mg/dL. For medical purposes only. Performed at [facility], [place] [address] [place], [place] [address]   Salicylate level     Status: None   Collection Time: 06/14/18  3:35 PM  Result Value Ref Range   Salicylate Lvl <0.7 2.8 - 30.0 mg/dL    Comment: Performed at [facility] [address], [place], [place] [address]  Acetaminophen level     Status: Abnormal   Collection Time: 06/14/18  3:35 PM  Result Value Ref Range   Acetaminophen (Tylenol), Serum <10 (L) 10 - 30 ug/mL    Comment: (NOTE) Therapeutic concentrations vary significantly. A range of 10-30 ug/mL  may be an effective concentration for many  patients. However, some  are best treated at concentrations outside of this range. Acetaminophen concentrations >150 ug/mL at 4 hours after ingestion  and >50 ug/mL at 12 hours after ingestion are often associated with  toxic reactions. Performed at [facility], [place] [address], [place], [place] [address]   cbc     Status: Abnormal   Collection Time: 06/14/18  3:35 PM  Result Value Ref Range   WBC 3.4 (L) 4.0 - 10.5 K/uL   RBC 4.77 4.22 - 5.81 MIL/uL   Hemoglobin 14.9 13.0 - 17.0 g/dL   HCT 45.0 39.0 - 52.0 %   MCV 94.3 78.0 - 100.0 fL   MCH 31.2 26.0 - 34.0 pg   MCHC 33.1 30.0 - 36.0 g/dL   RDW 12.8 11.5 - 15.5 %   Platelets 276 150 - 400 K/uL    Comment: Performed at [facility], [place] [address], [place], [place] [address]    Medications:  No current facility-administered medications for this encounter.    No current outpatient medications on file.    Musculoskeletal: Strength & Muscle Tone: within normal limits Gait & Station: normal Patient leans: N/A  Psychiatric Specialty Exam: Physical Exam  ROS  Blood pressure 115/80, pulse (!) 52, temperature 98 F (36.7 C), resp. rate 18, SpO2 100 %.There is no height or weight on file to calculate BMI.  General Appearance: Casual  Eye Contact:  Good  Speech:  Clear and Coherent and Normal Rate  Volume:  Normal  Mood:  Depressed, Hopeless, Irritable and Worthless  Affect:  Depressed  Thought Process:  Coherent and Goal Directed  Orientation:  Full (Time, Place, and Person)  Thought Content:  denies hallucinations, delusions, and paranoia  Suicidal Thoughts:  Yes.  with intent/plan  Homicidal Thoughts:  No  Memory:  Immediate;   Good Recent;   Good  Remote;   Good  Judgement:  Impaired  Insight:  Lacking and Shallow  Psychomotor Activity:  Normal  Concentration:  Concentration: Good and Attention Span: Good  Recall:  Good  Fund of Knowledge:  Good  Language:  Good  Akathisia:  No  Handed:  Right  AIMS  (if indicated):     Assets:  Communication Skills Desire for Improvement  ADL's:  Intact  Cognition:  WNL  Sleep:        Treatment Plan Summary: Daily contact with patient to assess and evaluate symptoms and progress in treatment, Medication management and Plan Inpatient psychiatric treatment  Disposition: Recommend psychiatric Inpatient admission when medically cleared.  This service was provided via telemedicine using a 2-way, interactive audio and video technology.  Names of all persons participating in this telemedicine service and their role in this encounter. Name: Earleen Newport, NP Role: Tele psych Assessment  Name: Dr. Dwyane Dee Role: Psychiatrist  Name: Nonah Mattes Role: Patient  Name:  Role:     Earleen Newport, NP 06/15/2018 10:20 AM

## 2018-06-15 NOTE — Progress Notes (Signed)
Austin Doyle is a 37 year old male pt admitted on voluntary basis. On admission he reports that he has been feeling depressed and suicidal and reports that he is just tired. He does endorse passive SI but is able to contract for safety on the unit. He reports that he was going to [facility] for medication management and then spoke about how they stopped giving him his medicine and reports that he is unsure why but thinks it may be because of medicaid he was on. He reports that he has been drinking on a daily basis and also using cocaine occasionally. He does not display any overt signs or symptoms of withdrawal and does not express any discomfort on admission. He reports that he would like to get back on his medications while he is here and feels the medications are helpful when he is on them. He reports that he is currently homeless and unsure of where he will go once he is discharged. Austin Doyle was escorted to the unit, oriented to the milieu and safety maintained.

## 2018-06-16 DIAGNOSIS — F3160 Bipolar disorder, current episode mixed, unspecified: Secondary | ICD-10-CM

## 2018-06-16 MED ORDER — TRAZODONE HCL 50 MG PO TABS
50.0000 mg | ORAL_TABLET | Freq: Every evening | ORAL | Status: DC | PRN
  Administered 2018-06-16 – 2018-06-17 (×2): 50 mg via ORAL
  Filled 2018-06-16 (×2): qty 1

## 2018-06-16 MED ORDER — SUMATRIPTAN SUCCINATE 50 MG PO TABS
50.0000 mg | ORAL_TABLET | ORAL | Status: DC | PRN
  Administered 2018-06-17 – 2018-06-18 (×3): 50 mg via ORAL
  Filled 2018-06-16 (×3): qty 1

## 2018-06-16 MED ORDER — DIVALPROEX SODIUM 250 MG PO DR TAB
750.0000 mg | DELAYED_RELEASE_TABLET | Freq: Every day | ORAL | Status: DC
  Administered 2018-06-16 – 2018-06-18 (×3): 750 mg via ORAL
  Filled 2018-06-16 (×5): qty 3

## 2018-06-16 MED ORDER — HYDROXYZINE HCL 50 MG PO TABS
50.0000 mg | ORAL_TABLET | Freq: Four times a day (QID) | ORAL | Status: DC | PRN
  Administered 2018-06-16 – 2018-06-21 (×7): 50 mg via ORAL
  Filled 2018-06-16: qty 10
  Filled 2018-06-16 (×7): qty 1

## 2018-06-16 MED ORDER — QUETIAPINE FUMARATE 200 MG PO TABS
200.0000 mg | ORAL_TABLET | Freq: Every day | ORAL | Status: DC
  Administered 2018-06-16 – 2018-06-21 (×6): 200 mg via ORAL
  Filled 2018-06-16 (×2): qty 1
  Filled 2018-06-16: qty 7
  Filled 2018-06-16 (×4): qty 1
  Filled 2018-06-16: qty 7

## 2018-06-16 NOTE — Tx Team (Signed)
Interdisciplinary Treatment and Diagnostic Plan Update  06/16/2018 Time of Session: 0830AM Austin Doyle MRN: [id number]  Principal Diagnosis: MDD, recurrent, severe  Secondary Diagnoses: Active Problems:   MDD (major depressive disorder), recurrent episode, severe ([facility])   Current Medications:  Current Facility-Administered Medications  Medication Dose Route Frequency Provider Last Rate Last Dose  . acetaminophen (TYLENOL) tablet 650 mg  650 mg Oral Q6H PRN Lindon Romp A, NP   650 mg at 06/15/18 2145  . hydrOXYzine (ATARAX/VISTARIL) tablet 25 mg  25 mg Oral TID PRN Rozetta Nunnery, NP   25 mg at 06/15/18 2145  . Influenza vac split quadrivalent PF (FLUARIX) injection 0.5 mL  0.5 mL Intramuscular Tomorrow-1000 Sharma Covert, MD      . pneumococcal 23 valent vaccine (PNU-IMMUNE) injection 0.5 mL  0.5 mL Intramuscular Tomorrow-1000 Sharma Covert, MD      . traZODone (DESYREL) tablet 50 mg  50 mg Oral QHS,MR X 1 Lindon Romp A, NP   50 mg at 06/15/18 2146   PTA Medications: No medications prior to admission.    Patient Stressors: Financial difficulties Medication change or noncompliance Substance abuse  Patient Strengths: Ability for insight Average or above average intelligence Capable of independent living [employer] of knowledge Motivation for treatment/growth  Treatment Modalities: Medication Management, Group therapy, Case management,  1 to 1 session with clinician, Psychoeducation, Recreational therapy.   Physician Treatment Plan for Primary Diagnosis: <principal problem not specified> Long Term Goal(s):     Short Term Goals:    Medication Management: Evaluate patient's response, side effects, and tolerance of medication regimen.  Therapeutic Interventions: 1 to 1 sessions, Unit Group sessions and Medication administration.  Evaluation of Outcomes: Not Met  Physician Treatment Plan for Secondary Diagnosis: Active Problems:   MDD (major depressive  disorder), recurrent episode, severe ([place])  Long Term Goal(s):     Short Term Goals:       Medication Management: Evaluate patient's response, side effects, and tolerance of medication regimen.  Therapeutic Interventions: 1 to 1 sessions, Unit Group sessions and Medication administration.  Evaluation of Outcomes: Not Met   RN Treatment Plan for Primary Diagnosis: MDD, recurrent, severe Long Term Goal(s): Knowledge of disease and therapeutic regimen to maintain health will improve  Short Term Goals: Ability to remain free from injury will improve, Ability to verbalize frustration and anger appropriately will improve, Ability to disclose and discuss suicidal ideas and Ability to identify and develop effective coping behaviors will improve  Medication Management: RN will administer medications as ordered by provider, will assess and evaluate patient's response and provide education to patient for prescribed medication. RN will report any adverse and/or side effects to prescribing provider.  Therapeutic Interventions: 1 on 1 counseling sessions, Psychoeducation, Medication administration, Evaluate responses to treatment, Monitor vital signs and CBGs as ordered, Perform/monitor CIWA, COWS, AIMS and Fall Risk screenings as ordered, Perform wound care treatments as ordered.  Evaluation of Outcomes: Not Met   LCSW Treatment Plan for Primary Diagnosis: MDD, recurrent, severe Long Term Goal(s): Safe transition to appropriate next level of care at discharge, Engage patient in therapeutic group addressing interpersonal concerns.  Short Term Goals: Engage patient in aftercare planning with referrals and resources, Facilitate patient progression through stages of change regarding substance use diagnoses and concerns and Identify triggers associated with mental health/substance abuse issues  Therapeutic Interventions: Assess for all discharge needs, 1 to 1 time with Social worker, Explore available  resources and support systems, Assess for adequacy in community  support network, Educate family and significant other(s) on suicide prevention, Complete Psychosocial Assessment, Interpersonal group therapy.  Evaluation of Outcomes: Not Met   Progress in Treatment: Attending groups: No. Participating in groups: No.New to unit. Continuing to assess.  Taking medication as prescribed: Yes. Toleration medication: Yes. Family/Significant other contact made: No, will contact:  family member if pt consents to collateral contact.  Patient understands diagnosis: Yes. Discussing patient identified problems/goals with staff: Yes. Medical problems stabilized or resolved: Yes. Denies suicidal/homicidal ideation: Yes. Issues/concerns per patient self-inventory: No. Other: n/a   New problem(s) identified: No, Describe:  n/a  New Short Term/Long Term Goal(s): detox if needed, medication management for mood stabilization; elimination of SI thoughts; development of comprehensive mental wellness/sobriety plan.   Patient Goals:  "I want to get back on my medications."   Discharge Plan or Barriers: CSW assessing for appropriate referrals. Pt states that he is a patient at [facility]. Pt lives in [place]. [place] pamphlet, Mobile Crisis information, and AA/NA information provided to patient for additional community support and resources.   Reason for Continuation of Hospitalization: Anxiety Depression Medication stabilization Suicidal ideation  Estimated Length of Stay: Monday, 06/21/18  Attendees: Patient: 06/16/2018 9:39 AM  Physician: Dr. Nancy Fetter MD; Dr. Mallie Darting MD 06/16/2018 9:39 AM  Nursing: Maudie Mercury RN; Alyssa RN 06/16/2018 9:39 AM  RN Care Manager:X 06/16/2018 9:39 AM  Social Worker: Janice Norrie LCSw 06/16/2018 9:39 AM  Recreational Therapist: x 06/16/2018 9:39 AM  Other: Lindell Spar NP 06/16/2018 9:39 AM  Other:  06/16/2018 9:39 AM  Other: 06/16/2018 9:39 AM    Scribe for Treatment Team: Avelina Laine, LCSW 06/16/2018 9:39 AM

## 2018-06-16 NOTE — [facility] Suicide Risk Assessment (Signed)
[facility] Admission Suicide Risk Assessment   Nursing information obtained from:  Patient Demographic factors:  Male, Low socioeconomic status, Living alone, Unemployed Current Mental Status:  Suicidal ideation indicated by patient, Self-harm thoughts, Self-harm behaviors Loss Factors:  NA Historical Factors:  Prior suicide attempts, Family history of mental illness or substance abuse Risk Reduction Factors:  Responsible for children under 61 years of age, Positive coping skills or problem solving skills  Total Time spent with patient: 1 hour Principal Problem: Bipolar affective disorder, current episode mixed, without psychotic features (HCC) Diagnosis:   Patient Active Problem List   Diagnosis Date Noted  . Bipolar affective disorder, current episode mixed, without psychotic features (HCC) [F31.60] 06/16/2018  . MDD (major depressive disorder), recurrent severe, without psychosis (HCC) [F33.2] 06/15/2018  . MDD (major depressive disorder), recurrent episode, severe (HCC) [F33.2] 06/15/2018  . Suicidal ideation [R45.851]    Subjective Data:   Austin Doyle is a 37 y/o M with history of Bipolar disorder and cocaine use disorder who was admitted voluntarily from [facility] where he presented with worsening depression, SI with plan to walk into traffic, AH, VH, recent use of cocaine, and poor outpatient medication adherence. Pt was medically cleared and then transferred to [facility] for additional treatment and stabilization.  Upon initial presentation, pt shares, "I'm bipolar, and I get these symptoms. I get migraines. It causes me to hallucinate, but then it's causing me to walk into traffic." Pt reports worsening depression with depressed mood, anhedonia, guilty feelings, low energy, poor concentration, and suicidal ideation with plan to walk into traffic. Pt reports distractibility and poor sleep as well. He denies symptoms of OCD and PTSD. He denies HI. He endorses AH of his "name being called." He  endorses VH of "a guy standing in the window at [facility]." He denies recent illicit substance use, and when asked about his UDS, pt reports that he was kissing a woman who had been smoking crack cocaine, which is why he believes that cocaine was found in his urine.  Discussed with patient about treatment options. He reports previous stability of his symptoms with combination of depakote and seroquel. He also reports that he was prescribed depakote for his migraines and they responded well. He agrees to be restarted on trial of depakote and seroquel. He will also be started on trial of imitrex for migraines. He will speak with SW team about possible referral to substance use treatment and/or referral to shelter/housing options after discharge. Pt expresses interest in possible referral to a half-way house. Pt was in agreement with the above plan, and he had no further questions, comments, or concerns.   Continued Clinical Symptoms:  Alcohol Use Disorder Identification Test Final Score (AUDIT): 14 The "Alcohol Use Disorders Identification Test", Guidelines for Use in Primary Care, Second Edition.  World Science writer [facility]). Score between 0-7:  no or low risk or alcohol related problems. Score between 8-15:  moderate risk of alcohol related problems. Score between 16-19:  high risk of alcohol related problems. Score 20 or above:  warrants further diagnostic evaluation for alcohol dependence and treatment.   CLINICAL FACTORS:   Severe Anxiety and/or Agitation Bipolar Disorder:   Depressive phase Alcohol/Substance Abuse/Dependencies More than one psychiatric diagnosis Currently Psychotic Unstable or Poor Therapeutic Relationship Previous Psychiatric Diagnoses and Treatments   Musculoskeletal: Strength & Muscle Tone: within normal limits Gait & Station: normal Patient leans: N/A  Psychiatric Specialty Exam: Physical Exam  Nursing note and vitals reviewed.   Review of Systems  Constitutional: Negative for chills and fever.  Respiratory: Negative for cough and shortness of breath.   Cardiovascular: Negative for chest pain.  Gastrointestinal: Negative for abdominal pain, heartburn, nausea and vomiting.  Psychiatric/Behavioral: Positive for depression, hallucinations, substance abuse and suicidal ideas. The patient is nervous/anxious and has insomnia.     Blood pressure 102/81, pulse 77, temperature 98.3 F (36.8 C), temperature source Oral, resp. rate 17, height 6' (1.829 m), weight 75.8 kg.Body mass index is 22.65 kg/m.  General Appearance: Casual and Fairly Groomed  Eye Contact:  Good  Speech:  Clear and Coherent and Normal Rate  Volume:  Normal  Mood:  Anxious and Depressed  Affect:  Appropriate, Congruent and Constricted  Thought Process:  Coherent and Goal Directed  Orientation:  Full (Time, Place, and Person)  Thought Content:  Delusions, Hallucinations: Auditory Visual and Paranoid Ideation  Suicidal Thoughts:  Yes.  with intent/plan  Homicidal Thoughts:  No  Memory:  Immediate;   Fair Recent;   Fair Remote;   Fair  Judgement:  Poor  Insight:  Lacking  Psychomotor Activity:  Normal  Concentration:  Concentration: Fair  Recall:  Poor  Fund of Knowledge:  Fair  Language:  Fair  Akathisia:  No  Handed:    AIMS (if indicated):     Assets:  Communication Skills Desire for Improvement Resilience Social Support  ADL's:  Intact  Cognition:  WNL  Sleep:  Number of Hours: 6.75   COGNITIVE FEATURES THAT CONTRIBUTE TO RISK:  None    SUICIDE RISK:   Moderate:  Frequent suicidal ideation with limited intensity, and duration, some specificity in terms of plans, no associated intent, good self-control, limited dysphoria/symptomatology, some risk factors present, and identifiable protective factors, including available and accessible social support.  PLAN OF CARE:   -Admit to inpatient level of care  -Bipolar I, current episode depressed      -Start depakote DR 750mg  po qhs   -Start seroquel 200mg  po qhs  -Anxiety  -Continue vistaril 50mg  po q6h prn anxiety  -Migraines    -Start imitrex 50mg  po q2h prn migraine  -Insomnia    -Start trazodone 50mg  po qhs prn insomnia (may repeat x1)  -Encourage participation in groups and therapeutic milieu  -disposition planning will be ongoing  I certify that inpatient services furnished can reasonably be expected to improve the patient's condition.   Micheal Likens, MD 06/16/2018, 2:06 PM

## 2018-06-16 NOTE — Progress Notes (Signed)
Patient ID: Austin Doyle, male   DOB: January 14, 1981, 37 y.o.   MRN: [id number]   D: Patient deniesHI and auditory and visual hallucinations.  Voices passive SI but contracts for safety. Patient has a depressed mood and affect. Looking forward to restarting his medications.  A: Patient given emotional support from RN. Patient given medications per MD orders. Patient encouraged to attend groups and unit activities. Patient encouraged to come to staff with any questions or concerns.  R: Patient remains cooperative and appropriate. Will continue to monitor patient for safety.

## 2018-06-16 NOTE — [facility] Suicide Risk Assessment (Signed)
[facility] INPATIENT:  Family/Significant Other Suicide Prevention Education  Suicide Prevention Education:  Patient Refusal for Family/Significant Other Suicide Prevention Education: The patient Austin Doyle has refused to provide written consent for family/significant other to be provided Family/Significant Other Suicide Prevention Education during admission and/or prior to discharge.  Physician notified.  SPE completed with pt, as pt refused to consent to family contact. SPI pamphlet provided to pt and pt was encouraged to share information with support network, ask questions, and talk about any concerns relating to SPE. Pt denies access to guns/firearms and verbalized understanding of information provided. Mobile Crisis information also provided to pt.   Rona Ravens LCSW 06/16/2018, 1:45 PM

## 2018-06-16 NOTE — H&P (Addendum)
Psychiatric Admission Assessment Adult  Patient Identification: Renee Beale  MRN:  [id number]  Date of Evaluation:  06/16/2018  Chief Complaint:  MDD RECURRENT  Principal Diagnosis: Bipolar affective disorder, current episode mixed, without psychotic features ([place])  Diagnosis:   Patient Active Problem List   Diagnosis Date Noted  . Bipolar affective disorder, current episode mixed, without psychotic features ([place]) [F31.60] 06/16/2018    Priority: High  . MDD (major depressive disorder), recurrent severe, without psychosis ([place]) [F33.2] 06/15/2018  . MDD (major depressive disorder), recurrent episode, severe ([place]) [F33.2] 06/15/2018  . Suicidal ideation [R45.851]    History of Present Illness: This is an admission assessment for this 37 year old AA male with hx of mental illness. Admitted to the [facility] from the [facility] hospital with complaints of suicidal ideations with plan to get hit by a car. Chart review indicated that patient was brought to the [facility] ED by the [employer] after patient was spotted dodging cars. He was trying to get hit by a car so to die. Apparently, patient reported at the ED that all these were triggered by Migraine headaches. He has been homeless x 2 years.  During this assessment, Lavaris reports, "I was taken to the [facility] ED 2 days ago by the cops. I was having bad Migraine headaches. I have not been on my medicines in a long time. I take Depakote for Migraine headaches. I have not been on it in a long time. That is how all these shit started. I was trying to get a car to hit me so I can just die & not deal with this shit no more. My medicaid was taken away, I could not afford my medicines. I'm depressed because of this headache. This is my first trying to hurt myself. I used to take Seroquel & Trazodone for insomnia. I was also on an anxiety medicines, I just can't think of the name right now. I take medicines for Bipolar disorder. I was diagnosed a long  time ago. I was hospitalized [facility] the [facility] 3 or 4 years ago for my mental health. I started with the Crisis center, then to [facility]. I have been with [facility] for a while. When I get this headaches, they will be like all over my head & at the back of my head. It is kind of a weird shit that causes me to be seeing shit, hearing shit & doing weird shit stuff. I came from a family of bipolar people. My sister got it. I'm on probation for assault. When I fall asleep, I got this numbness to my hands & feet".  Associated Signs/Symptoms:  Depression Symptoms:  depressed mood, insomnia, psychomotor agitation, anxiety,  (Hypo) Manic Symptoms:  Impulsivity, Labiality of Mood,  Anxiety Symptoms:  Excessive Worry,  Psychotic Symptoms:  Hallucinations: Auditory Visual  PTSD Symptoms: Denies any PTSD symptoms or events.  Total Time spent with patient: 1 hour  Past Psychiatric History: Bipolar disorder.  Is the patient at risk to self? No.  Has the patient been a risk to self in the past 6 months? Yes.    Has the patient been a risk to self within the distant past? No.  Is the patient a risk to others? No.  Has the patient been a risk to others in the past 6 months? No.  Has the patient been a risk to others within the distant past? No.   Prior Inpatient Therapy: Yes [facility]).  Prior Outpatient Therapy: Yes [facility]).  Alcohol Screening: 1. How often do you have a drink containing alcohol?: 4 or more times a week 2. How many drinks containing alcohol do you have on a typical day when you are drinking?: 7, 8, or 9 3. How often do you have six or more drinks on one occasion?: Weekly AUDIT-C Score: 10 4. How often during the last year have you found that you were not able to stop drinking once you had started?: Weekly 5. How often during the last year have you failed to do what was normally expected from you becasue of drinking?: Less than monthly 6. How  often during the last year have you needed a first drink in the morning to get yourself going after a heavy drinking session?: Never 7. How often during the last year have you had a feeling of guilt of remorse after drinking?: Never 8. How often during the last year have you been unable to remember what happened the night before because you had been drinking?: Never 9. Have you or someone else been injured as a result of your drinking?: No 10. Has a relative or friend or a doctor or another health worker been concerned about your drinking or suggested you cut down?: No Alcohol Use Disorder Identification Test Final Score (AUDIT): 14 Intervention/Follow-up: Alcohol Education  Substance Abuse History in the last 12 months:  Yes.    Consequences of Substance Abuse: Medical Consequences:  Liver damage, Possible death by overdose Legal Consequences:  Arrests, jail time, Loss of driving privilege. Family Consequences:  Family discord, divorce and or separation.  Previous Psychotropic Medications: Yes   Psychological Evaluations: No   Past Medical History:  Past Medical History:  Diagnosis Date  . Anxiety   . Depression    History reviewed. No pertinent surgical history.  Family History: History reviewed. No pertinent family history.  Family Psychiatric  History: Bipolar disorder: Sister.  Tobacco Screening: Have you used any form of tobacco in the last 30 days? (Cigarettes, Smokeless Tobacco, Cigars, and/or Pipes): Yes Tobacco use, Select all that apply: 5 or more cigarettes per day Are you interested in Tobacco Cessation Medications?: No, patient refused Counseled patient on smoking cessation including recognizing danger situations, developing coping skills and basic information about quitting provided: Refused/Declined practical counseling  Social History:  Social History   Substance and Sexual Activity  Alcohol Use Yes  . Frequency: Never     Social History   Substance and  Sexual Activity  Drug Use Yes  . Types: Cocaine    Additional Social History: Single, 4 children, homeless, disabled, collects SSI, smokes 1/2 pack cigarettes daily.  Allergies:  No Known Allergies  Lab Results:  Results for orders placed or performed during the hospital encounter of 06/14/18 (from the past 48 hour(s))  Comprehensive metabolic panel     Status: Abnormal   Collection Time: 06/14/18  3:35 PM  Result Value Ref Range   Sodium 139 135 - 145 mmol/L   Potassium 4.7 3.5 - 5.1 mmol/L   Chloride 103 98 - 111 mmol/L   CO2 30 22 - 32 mmol/L   Glucose, Bld 122 (H) 70 - 99 mg/dL   BUN 8 6 - 20 mg/dL   Creatinine, Ser 1.11 0.61 - 1.24 mg/dL   Calcium 9.4 8.9 - 10.3 mg/dL   Total Protein 7.0 6.5 - 8.1 g/dL   Albumin 3.7 3.5 - 5.0 g/dL   AST 26 15 - 41 U/L   ALT  20 0 - 44 U/L   Alkaline Phosphatase 48 38 - 126 U/L   Total Bilirubin 0.9 0.3 - 1.2 mg/dL   GFR calc non Af Amer >60 >60 mL/min   GFR calc Af Amer >60 >60 mL/min    Comment: (NOTE) The eGFR has been calculated using the CKD EPI equation. This calculation has not been validated in all clinical situations. eGFR's persistently <60 mL/min signify possible Chronic Kidney Disease.    Anion gap 6 5 - 15    Comment: Performed at [facility] [address], [place], [place] [address]  Ethanol     Status: None   Collection Time: 06/14/18  3:35 PM  Result Value Ref Range   Alcohol, Ethyl (B) <10 <10 mg/dL    Comment: (NOTE) Lowest detectable limit for serum alcohol is 10 mg/dL. For medical purposes only. Performed at [facility], [place] [address] [place], [place] [address]   Salicylate level     Status: None   Collection Time: 06/14/18  3:35 PM  Result Value Ref Range   Salicylate Lvl <8.2 2.8 - 30.0 mg/dL    Comment: Performed at [facility] [address], [place], [place] [address]  Acetaminophen level     Status: Abnormal   Collection Time: 06/14/18  3:35 PM  Result Value Ref Range    Acetaminophen (Tylenol), Serum <10 (L) 10 - 30 ug/mL    Comment: (NOTE) Therapeutic concentrations vary significantly. A range of 10-30 ug/mL  may be an effective concentration for many patients. However, some  are best treated at concentrations outside of this range. Acetaminophen concentrations >150 ug/mL at 4 hours after ingestion  and >50 ug/mL at 12 hours after ingestion are often associated with  toxic reactions. Performed at [facility], [place] [address] [place], [place] [address]   cbc     Status: Abnormal   Collection Time: 06/14/18  3:35 PM  Result Value Ref Range   WBC 3.4 (L) 4.0 - 10.5 K/uL   RBC 4.77 4.22 - 5.81 MIL/uL   Hemoglobin 14.9 13.0 - 17.0 g/dL   HCT 45.0 39.0 - 52.0 %   MCV 94.3 78.0 - 100.0 fL   MCH 31.2 26.0 - 34.0 pg   MCHC 33.1 30.0 - 36.0 g/dL   RDW 12.8 11.5 - 15.5 %   Platelets 276 150 - 400 K/uL    Comment: Performed at [facility], [place] [address], [place], [place] [address]   Blood Alcohol level:  Lab Results  Component Value Date   ETH <10 86/76/1952   Metabolic Disorder Labs:  No results found for: HGBA1C, MPG No results found for: PROLACTIN No results found for: CHOL, TRIG, HDL, CHOLHDL, VLDL, LDLCALC  Current Medications: Current Facility-Administered Medications  Medication Dose Route Frequency Provider Last Rate Last Dose  . acetaminophen (TYLENOL) tablet 650 mg  650 mg Oral Q6H PRN Lindon Romp A, NP   650 mg at 06/15/18 2145  . hydrOXYzine (ATARAX/VISTARIL) tablet 25 mg  25 mg Oral TID PRN Rozetta Nunnery, NP   25 mg at 06/15/18 2145  . Influenza vac split quadrivalent PF (FLUARIX) injection 0.5 mL  0.5 mL Intramuscular Tomorrow-1000 Sharma Covert, MD      . pneumococcal 23 valent vaccine (PNU-IMMUNE) injection 0.5 mL  0.5 mL Intramuscular Tomorrow-1000 Sharma Covert, MD      . traZODone (DESYREL) tablet 50 mg  50 mg Oral QHS,MR X 1 Gwenlyn Found,  Rinaldo Ratel, NP   50 mg at 06/15/18 2146   PTA Medications: No medications  prior to admission.   Musculoskeletal: Strength & Muscle Tone: within normal limits Gait & Station: normal Patient leans: N/A  Psychiatric Specialty Exam: Physical Exam  Nursing note and vitals reviewed. Constitutional: He is oriented to person, place, and time. He appears well-developed.  HENT:  Head: Normocephalic.  Eyes: Pupils are equal, round, and reactive to light.  Neck: Normal range of motion.  Cardiovascular: Normal rate.  Respiratory: Effort normal.  GI: Soft.  Genitourinary:  Genitourinary Comments: Deferred  Musculoskeletal: Normal range of motion.  Neurological: He is alert and oriented to person, place, and time.  Skin: Skin is warm and dry.    Review of Systems  Constitutional: Negative.   HENT: Negative.        Hx. Migraine headaches.  Eyes: Negative.   Respiratory: Negative.  Negative for cough and shortness of breath.   Cardiovascular: Negative.  Negative for chest pain and palpitations.  Gastrointestinal: Negative.   Genitourinary: Negative.   Musculoskeletal: Negative.   Skin: Negative.   Neurological: Negative.   Endo/Heme/Allergies: Negative.   Psychiatric/Behavioral: Positive for depression, substance abuse (UDS (+) for Cocaine) and suicidal ideas. Negative for hallucinations and memory loss. The patient is nervous/anxious and has insomnia.     Blood pressure 102/81, pulse 77, temperature 98.3 F (36.8 C), temperature source Oral, resp. rate 17, height 6' (1.829 m), weight 75.8 kg.Body mass index is 22.65 kg/m.  General Appearance: Casual and Disheveled  Eye Contact:  Fair  Speech:  Clear and Coherent  Volume:  Increased  Mood:  Anxious and Depressed  Affect:  Constricted  Thought Process:  Coherent, Goal Directed and Descriptions of Associations: Intact  Orientation:  Full (Time, Place, and Person)  Thought Content:  Logical and Hallucinations: Auditory Visual  Suicidal Thoughts:  Currently denies any thoughts, plans or intent. says he feels  safe here  Homicidal Thoughts:  Denies  Memory:  Immediate;   Good Recent;   Good Remote;   Good  Judgement:  Fair  Insight:  Fair  Psychomotor Activity:  Normal  Concentration:  Concentration: Good and Attention Span: Good  Recall:  [employer] of Knowledge:  Fair  Language:  Good  Akathisia:  Negative  Handed:  Right  AIMS (if indicated):     Assets:  Communication Skills Desire for Improvement  ADL's:  Intact  Cognition:  WNL  Sleep:  Number of Hours: 6.75   Treatment Plan/Recommendations: 1. Admit for crisis management and stabilization, estimated length of stay 3-5 days.   2. Medication management to reduce current symptoms to base line and improve the patient's overall level of functioning: See MAR, Md's SRA & treatment plan.   Observation Level/Precautions:  15 minute checks  Laboratory:  Per ED, UDS (+) for Cocaine  Psychotherapy: Group sessions    Medications: See MAR.   Consultations: As needed.   Discharge Concerns: Safety, mood stability.   Estimated LOS: 2-4 days  Other: Admit to the [facility]Hall.    Physician Treatment Plan for Primary Diagnosis: Bipolar affective disorder, current episode mixed, without psychotic features ([place])  Long Term Goal(s): Improvement in symptoms so as ready for discharge  Short Term Goals: Ability to identify changes in lifestyle to reduce recurrence of condition will improve, Ability to disclose and discuss suicidal ideas and Ability to demonstrate self-control will improve  Physician Treatment Plan for Secondary Diagnosis: Principal Problem:   Bipolar affective disorder, current episode  mixed, without psychotic features ([place]) Active Problems:   MDD (major depressive disorder), recurrent episode, severe ([place])  Long Term Goal(s): Improvement in symptoms so as ready for discharge  Short Term Goals: Ability to identify and develop effective coping behaviors will improve, Compliance with prescribed medications will improve and Ability to  identify triggers associated with substance abuse/mental health issues will improve  I certify that inpatient services furnished can reasonably be expected to improve the patient's condition.    Lindell Spar, NP, PMHNP, FNP-BC 9/11/201910:42 AM    I have reviewed NP's Note, assessement, diagnosis and plan, and agree. I have also met with patient and completed suicide risk assessment.   Belenda Cruise "Bizzy" Eskew is a 37 y/o M with history of Bipolar disorder and cocaine use disorder who was admitted voluntarily from [facility] where he presented with worsening depression, SI with plan to walk into traffic, AH, VH, recent use of cocaine, and poor outpatient medication adherence. Pt was medically cleared and then transferred to [facility] for additional treatment and stabilization.  Upon initial presentation, pt shares, "I'm bipolar, and I get these symptoms. I get migraines. It causes me to hallucinate, but then it's causing me to walk into traffic." Pt reports worsening depression with depressed mood, anhedonia, guilty feelings, low energy, poor concentration, and suicidal ideation with plan to walk into traffic. Pt reports distractibility and poor sleep as well. He denies symptoms of OCD and PTSD. He denies HI. He endorses AH of his "name being called." He endorses VH of "a guy standing in the window at [facility]." He denies recent illicit substance use, and when asked about his UDS, pt reports that he was kissing a woman who had been smoking crack cocaine, which is why he believes that cocaine was found in his urine.  Discussed with patient about treatment options. He reports previous stability of his symptoms with combination of depakote and seroquel. He also reports that he was prescribed depakote for his migraines and they responded well. He agrees to be restarted on trial of depakote and seroquel. He will also be started on trial of imitrex for migraines. He will speak with SW team about possible referral to  substance use treatment and/or referral to shelter/housing options after discharge. Pt expresses interest in possible referral to a half-way house. Pt was in agreement with the above plan, and he had no further questions, comments, or concerns.   PLAN OF CARE:   -Admit to inpatient level of care  -Bipolar I, current episode depressed             -Start depakote DR 797m po qhs             -Start seroquel 2051mpo qhs  -Anxiety             -Continue vistaril 5020mo q6h prn anxiety  -Migraines               -Start imitrex 101m23m q2h prn migraine  -Insomnia              -Start trazodone 101mg52mqhs prn insomnia (may repeat x1)  -Encourage participation in groups and therapeutic milieu  -disposition planning will be ongoing  ChrisMaris Berger

## 2018-06-16 NOTE — Progress Notes (Signed)
Recreation Therapy Notes  Date: 9.11.19 Time: 0930 Location: [facility] Hall Dayroom  Group Topic: Stress Management  Goal Area(s) Addresses:  Patient will verbalize importance of using healthy stress management.  Patient will identify positive emotions associated with healthy stress management.   Intervention: Stress Management  Activity :  Guided Imagery.  LRT introduced the stress management technique of guided imagery.  LRT read a script for patients to envision seeing a starry sky at night.  Patients were to follow along as script was read to engage in activity.  Education:  Stress Management, Discharge Planning.   Education Outcome: Acknowledges edcuation/In group clarification offered/Needs additional education  Clinical Observations/Feedback: Pt did not attend group.    Caroll Rancher, LRT/CTRS     Caroll Rancher A 06/16/2018 11:58 AM

## 2018-06-16 NOTE — Plan of Care (Signed)
  Problem: Activity: Goal: Interest or engagement in activities will improve Outcome: Progressing-Chaise attended evening group and was noted interacting some with peers in the day room.

## 2018-06-16 NOTE — B[facility] Counselor (Signed)
Adult Comprehensive Assessment  Patient ID: Austin Doyle, male   DOB: July 03, 1981, 37 y.o.   MRN: [id number]  Information Source: Information source: Patient  Current Stressors:  Patient states their primary concerns and needs for treatment are:: depression; migraines/uncontrolled pain causing mood lability, impulsivity, and SI with plan to jump in front of car; chronic homelessness Patient states their goals for this hospitilization and ongoing recovery are:: "to get my medications fixed and get help with pain." Educational / Learning stressors: high school Employment / Job issues: unemployed; "I think I get disability."  Family Relationships: three kids; sees them occassionally; single. close with sister Financial / Lack of resources (include bankruptcy): reports he gets disability but medicaid was recently cancelled Housing / Lack of housing: homeless chronically; "I can't remember the last time I was in a stable place." Physical health (include injuries & life threatening diseases): chronic migraines ; also my hands and feet go numb when I sleep alot Social relationships: poor-no identified positive social supports Substance abuse: denies however pt was positive for cocaine. "I kissed a girl that had just done cocaine and got it in my mouth." Bereavement / Loss: none identified.   Living/Environment/Situation:  Living Arrangements: Alone Living conditions (as described by patient or guardian): "homeless and living on the street." Who else lives in the home?: varies--mostly alone How long has patient lived in current situation?: several months/a year What is atmosphere in current home: Chaotic, Temporary, Dangerous  Family History:  Marital status: Single Are you sexually active?: Yes What is your sexual orientation?: heterosexual Has your sexual activity been affected by drugs, alcohol, medication, or emotional stress?: no  Does patient have children?: Yes How many children?:  3 How is patient's relationship with their children?: "I see them sometimes."   Childhood History:  Additional childhood history information: "Okay childhood." Description of patient's relationship with caregiver when they were a child: close to mother; strained with father Patient's description of current relationship with people who raised him/her: poor relationship with parents in adulthood. pt vague and declined to provide details about relationship.  How were you disciplined when you got in trouble as a child/adolescent?: whoopings Does patient have siblings?: Yes Number of Siblings: 1 Description of patient's current relationship with siblings: "I have a sister. She has bipolar disorder too. " Did patient suffer any verbal/emotional/physical/sexual abuse as a child?: No Did patient suffer from severe childhood neglect?: No Has patient ever been sexually abused/assaulted/raped as an adolescent or adult?: No Was the patient ever a victim of a crime or a disaster?: No Witnessed domestic violence?: No Has patient been effected by domestic violence as an adult?: No  Education:  Highest grade of school patient has completed: high school  Currently a student?: No Learning disability?: No  Employment/Work Situation:   Employment situation: On disability Why is patient on disability: "I don't know>" How long has patient been on disability: few years. pt vague about being on disability. Patient's job has been impacted by current illness: No What is the longest time patient has a held a job?: declined to answer Where was the patient employed at that time?: "I can't remember the last time I worked." Pt alluded to working "under the table" jobs but declined to discuss further.  Did You Receive Any Psychiatric Treatment/Services While in the Military?: No(n/a) Are There Guns or Other Weapons in Your Home?: No Are These Weapons Safely Secured?: (n/a)  Financial Resources:   Financial  resources: No income Does patient have a  representative payee or guardian?: No  Alcohol/Substance Abuse:   What has been your use of drugs/alcohol within the last 12 months?: pt denies; pt positive for cocaine- "I was kissing a girl that had just done cocaine and must have gotten some in my mouth a few days ago."  If attempted suicide, did drugs/alcohol play a role in this?: Yes(prior to admission, pt had thoughts to jump in front of moving car ) Alcohol/Substance Abuse Treatment Hx: Past Tx, Inpatient If yes, describe treatment: [facility] four years ago. "they didn't do nothin to help me."  Has alcohol/substance abuse ever caused legal problems?: Yes(multiple assaults and jailed several times. currently on probation)  Social Support System:   Patient's Community Support System: None Describe Community Support System: pt could not identify any positive social supports Type of faith/religion: n/a How does patient's faith help to cope with current illness?: n/a  Leisure/Recreation:   Leisure and Hobbies: watching tv; listening to music.   Strengths/Needs:   What is the patient's perception of their strengths?: "I don't know." Patient states they can use these personal strengths during their treatment to contribute to their recovery: "I don't know." Patient states these barriers may affect/interfere with their treatment: homeless; no supports; pt declined resources for [facility]; shelters, etc Patient states these barriers may affect their return to the community: mediciad recently terminated--unable to afford his medication Other important information patient would like considered in planning for their treatment: none identified.   Discharge Plan:   Currently receiving community mental health services: Yes (From [facility])([facility]) Patient states concerns and preferences for aftercare planning are: none-"I will go back to [facility]." Patient states they will know when they are safe and ready  for discharge when: "when I'm feeling less depressed." Does patient have access to transportation?: Yes(bus) Does patient have financial barriers related to discharge medications?: Yes Patient description of barriers related to discharge medications: no income other than money from intermittent "side jobs." no medicaid currently; pt evasive when asked about disability income.  Will patient be returning to same living situation after discharge?: Yes(plans to "return to the street. " pt declined IRC/Shelter/[facility] resources)  Summary/Recommendations:   Summary and Recommendations (to be completed by the evaluator): Patient is 37yo male who identifies as homeless in [place], [place] He presents to the hospital seeking treatment for SI with a plan, increased depression/mood lability/impulsivity, and for medication stabilization. Pt denies substance use however was positive for cocaine upon admission. Pt is homeless, unemployed, single with three children, and is currently on probation. Pt has a primary diagnosis of MDD. Pt goes to [facility] for outaptient services and plans to resume outpatient care at that agency after discharge. Recommendations for pt include: crisis stabilization, therapeutic milieu, encourage group attendance and participation, medication management for mood stabilization, and development of comrpehensive mental wellness/sobriety plan. CSW assessing for appropriate referrals.   Rona Ravens LCSW 06/16/2018 1:45 PM

## 2018-06-16 NOTE — Progress Notes (Signed)
D: Pt was in bed in his room upon initial approach.  Pt presents with anxious affect and anxious, depressed mood.  Describes his mood as "tired" and made goal with writer to "be safe and sleep well."  Pt denies SI/HI, denies hallucinations, reports pain from headache of 5/10.  Pt has been visible in milieu interacting with peers and staff appropriately.    A: Introduced self to pt.  Actively listened to pt and offered support and encouragement. Medications administered per order.  PRN medication administered for pain, anxiety, and sleep.  Q15 minute safety checks maintained.  R: Pt is safe on the unit.  Pt is compliant with medications.  Pt verbally contracts for safety.  Will continue to monitor and assess.

## 2018-06-16 NOTE — Progress Notes (Signed)
Per pt request, pt's probation officer (Officer Hezzie Bump) [phone] was contacted/voicemail left informing her of pt's hospitalization. Pt provided with officer Lee's contact information and was encouraged to contact her as well to follow-up.  Heavin Sebree S. Alan Ripper, MSW, LCSW Clinical Social Worker 06/16/2018 2:38 PM

## 2018-06-16 NOTE — Progress Notes (Signed)
D:  Tomoya was pleasant and cooperative on initial approach.  He continued to voice passive SI but will contact staff to talk if that worsens.  He denied HI or A/V hallucinations.  He reported feeling safe here and is ready to get medications started back.  He complained of headache and tylenol was given with good relief.  He requested something to help him sleep and for his anxiety.  He was given trazodone and hydroxyzine with good relief.  He is currently resting with his eyes closed and appears to be asleep.  EKG was completed. A:  1:1 with RN for support and encouragement.  Medications as ordered.  Q 15 minute checks maintained for safety.  Encouraged participation in group and unit activities.   R:  Gergory remains safe on the unit.  We will continue to monitor the progress towards his goals.

## 2018-06-16 NOTE — [facility] Group Notes (Signed)
LCSW Group Therapy Note 06/16/2018 10:39 AM  Type of Therapy/Topic: Group Therapy: Balance in Life  Participation Level: Active  Description of Group:  This group will address the concept of balance and how it feels and looks when one is unbalanced. Patients will be encouraged to process areas in their lives that are out of balance and identify reasons for remaining unbalanced. Facilitators will guide patients in utilizing problem-solving interventions to address and correct the stressor making their life unbalanced. Understanding and applying boundaries will be explored and addressed for obtaining and maintaining a balanced life. Patients will be encouraged to explore ways to assertively make their unbalanced needs known to significant others in their lives, using other group members and facilitator for support and feedback.  Therapeutic Goals: 1. Patient will identify two or more emotions or situations they have that consume much of in their lives. 2. Patient will identify signs/triggers that life has become out of balance:  3. Patient will identify two ways to set boundaries in order to achieve balance in their lives:  4. Patient will demonstrate ability to communicate their needs through discussion and/or role plays  Summary of Patient Progress:  Austin Doyle was engaged and participated throughout the group session. Austin Doyle reports that balance for him is "having all my mental symptoms leveled out". Austin Doyle reports that he plans to find stable housing and changing his environment in order to remain balanced moving forward.     Therapeutic Modalities:  Cognitive Behavioral Therapy Solution-Focused Therapy Assertiveness Training   Alcario Drought Clinical Social Worker

## 2018-06-17 DIAGNOSIS — R45851 Suicidal ideations: Secondary | ICD-10-CM

## 2018-06-17 DIAGNOSIS — F1420 Cocaine dependence, uncomplicated: Secondary | ICD-10-CM

## 2018-06-17 DIAGNOSIS — G4700 Insomnia, unspecified: Secondary | ICD-10-CM

## 2018-06-17 DIAGNOSIS — F17210 Nicotine dependence, cigarettes, uncomplicated: Secondary | ICD-10-CM

## 2018-06-17 DIAGNOSIS — F419 Anxiety disorder, unspecified: Secondary | ICD-10-CM

## 2018-06-17 NOTE — [facility] Group Notes (Signed)
LCSW Group Therapy Note   06/17/2018 1:15pm   Type of Therapy and Topic:  Group Therapy:  Overcoming Obstacles   Participation Level:  Did Not Attend--pt invited. Chose to remain in bed.    Description of Group:    In this group patients will be encouraged to explore what they see as obstacles to their own wellness and recovery. They will be guided to discuss their thoughts, feelings, and behaviors related to these obstacles. The group will process together ways to cope with barriers, with attention given to specific choices patients can make. Each patient will be challenged to identify changes they are motivated to make in order to overcome their obstacles. This group will be process-oriented, with patients participating in exploration of their own experiences as well as giving and receiving support and challenge from other group members.   Therapeutic Goals: 1. Patient will identify personal and current obstacles as they relate to admission. 2. Patient will identify barriers that currently interfere with their wellness or overcoming obstacles.  3. Patient will identify feelings, thought process and behaviors related to these barriers. 4. Patient will identify two changes they are willing to make to overcome these obstacles:      Summary of Patient Progress x     Therapeutic Modalities:   Cognitive Behavioral Therapy Solution Focused Therapy Motivational Interviewing Relapse Prevention Therapy  Rona RavensHeather S Jamiyah Dingley, LCSW 06/17/2018 11:21 AM

## 2018-06-17 NOTE — Progress Notes (Signed)
[facility]  06/17/2018 12:58 PM Randa EvensGregory Spruiell  MRN:  [id number]  Subjective: Austin Doyle reports, "I'm fine, doing good. I'm not having much symptoms of depression today. I'm taking the medicines, no side effects. I went to group sessions yesterday, have not been today".  Austin Doyle "Bizzy" Chestine SporeClark is a 37 y/o M with history of Bipolar disorder and cocaine use disorder who was admitted voluntarily from [facility] where he presented with worsening depression, SI with plan to walk into traffic, AH, VH, recent use of cocaine, and poor outpatient medication adherence. Pt was medically cleared and then [facility] and [facility]. Upon initial presentation, pt shares, "I'm bipolar, and I get these symptoms. I get migraines. It causes me to hallucinate, but then it's causing me to walk into traffic." Pt reports worsening depression with depressed mood, anhedonia, guilty feelings, low energy, poor concentration, and suicidal ideation with plan to walk into traffic. Pt reports distractibility and poor sleep as well. He denies symptoms of OCD and PTSD. He denies HI. He endorses AH of his "name being called." He endorses VH of [facility] He denies recent illicit substance use, and when asked about his UDS, pt reports that he was kissing a woman who had been smoking crack cocaine, which is why he believes that cocaine was found in his urine.   "Austin Doyle is seen, chart reviewed.The chart findings discussed with the treatment team. He presents alert, verbally responsive & able to make his needs known. He presents with a flat affect, not making any eye contact, He says he is fine, doing good, not so much depression, taking the medicines, denies side effects. He speaks in a monotonic voice. He also denies any SIHI, AVH, delusional thoughts or paranoia. He denies any other issues or concerns. He agreed to continue current plan of care already in  progress.  Principal Problem: Bipolar affective disorder, current episode mixed, without psychotic features (HCC)  Diagnosis:   Patient Active Problem List   Diagnosis Date Noted  . Bipolar affective disorder, current episode mixed, without psychotic features (HCC) [F31.60] 06/16/2018    Priority: High  . MDD (major depressive disorder), recurrent severe, without psychosis (HCC) [F33.2] 06/15/2018  . MDD (major depressive disorder), recurrent episode, severe (HCC) [F33.2] 06/15/2018  . Suicidal ideation [R45.851]    Total Time spent with patient: 25 minutes  Past Psychiatric History: See H&P  Past Medical History:  Past Medical History:  Diagnosis Date  . Anxiety   . Depression    History reviewed. No pertinent surgical history.  Family History: History reviewed. No pertinent family history.  Family Psychiatric  History: See H&P.  Social History:  Social History   Substance and Sexual Activity  Alcohol Use Yes  . Frequency: Never     Social History   Substance and Sexual Activity  Drug Use Yes  . Types: Cocaine    Social History   Socioeconomic History  . Marital status: Single    Spouse name: Not on file  . Number of children: Not on file  . Years of education: Not on file  . Highest education level: Not on file  Occupational History  . Not on file  Social Needs  . Financial resource strain: Not on file  . Food insecurity:    Worry: Not on file    Inability: Not on file  . Transportation needs:    Medical: Not on file    Non-medical: Not on  file  Tobacco Use  . Smoking status: Current Every Day Smoker    Types: Cigarettes  . Smokeless tobacco: Never Used  Substance and Sexual Activity  . Alcohol use: Yes    Frequency: Never  . Drug use: Yes    Types: Cocaine  . Sexual activity: Yes  Lifestyle  . Physical activity:    Days per week: Not on file    Minutes per session: Not on file  . Stress: Not on file  Relationships  . Social connections:     Talks on phone: Not on file    Gets together: Not on file    Attends religious service: Not on file    Active member of club or organization: Not on file    Attends meetings of clubs or organizations: Not on file    Relationship status: Not on file  Other Topics Concern  . Not on file  Social History Narrative  . Not on file   Additional Social History:   Sleep: Good  Appetite:  Good  Current Medications: Current Facility-Administered Medications  Medication Dose Route Frequency Provider Last Rate Last Dose  . acetaminophen (TYLENOL) tablet 650 mg  650 mg Oral Q6H PRN Nira Conn A, NP   650 mg at 06/16/18 2206  . divalproex (DEPAKOTE) DR tablet 750 mg  750 mg Oral QHS Cindra Austad I, NP   750 mg at 06/16/18 2122  . hydrOXYzine (ATARAX/VISTARIL) tablet 50 mg  50 mg Oral Q6H PRN Micheal Likens, MD   50 mg at 06/16/18 2121  . QUEtiapine (SEROQUEL) tablet 200 mg  200 mg Oral QHS Salif Tay I, NP   200 mg at 06/16/18 2122  . SUMAtriptan (IMITREX) tablet 50 mg  50 mg Oral Q2H PRN Nguyen Todorov I, NP      . traZODone (DESYREL) tablet 50 mg  50 mg Oral QHS PRN,MR X 1 Micheal Likens, MD   50 mg at 06/16/18 2121   Lab Results: No results found for this or any previous visit (from the past 48 hour(s)).  Blood Alcohol level:  Lab Results  Component Value Date   ETH <10 06/14/2018   Metabolic Disorder Labs: No results found for: HGBA1C, MPG No results found for: PROLACTIN No results found for: CHOL, TRIG, HDL, CHOLHDL, VLDL, LDLCALC  Physical Findings: AIMS: Facial and Oral Movements Muscles of Facial Expression: None, normal Lips and Perioral Area: None, normal Jaw: None, normal Tongue: None, normal,Extremity Movements Upper (arms, wrists, hands, fingers): None, normal Lower (legs, knees, ankles, toes): None, normal, Trunk Movements Neck, shoulders, hips: None, normal, Overall Severity Severity of abnormal movements (highest score from questions above):  None, normal Incapacitation due to abnormal movements: None, normal Patient's awareness of abnormal movements (rate only patient's report): No Awareness, Dental Status Current problems with teeth and/or dentures?: No Does patient usually wear dentures?: No  CIWA:    COWS:     Musculoskeletal: Strength & Muscle Tone: within normal limits Gait & Station: normal Patient leans: N/A  Psychiatric Specialty Exam: Physical Exam  Nursing note and vitals reviewed. Constitutional: He appears well-developed and well-nourished.    Review of Systems  Psychiatric/Behavioral: Positive for depression ("Improving") and substance abuse (Hx. Cocaine use disorder). Negative for hallucinations (Hx. psychosis), memory loss and suicidal ideas. The patient is not nervous/anxious and does not have insomnia.     Blood pressure 102/81, pulse 77, temperature 98.3 F (36.8 C), temperature source Oral, resp. rate 17, height 6' (1.829 m), weight 75.8  kg.Body mass index is 22.65 kg/m.  General Appearance: Casual and Fairly Groomed  Eye Contact:  Good  Speech:  Clear and Coherent and Normal Rate  Volume:  Normal  Mood:  Anxious and Depressed  Affect:  Appropriate, Congruent and Constricted  Thought Process:  Coherent and Goal Directed  Orientation:  Full (Time, Place, and Person)  Thought Content:  Delusions, Hallucinations: Auditory Visual and Paranoid Ideation  Suicidal Thoughts:  Yes.  with intent/plan  Homicidal Thoughts:  No  Memory:  Immediate;   Fair Recent;   Fair Remote;   Fair  Judgement:  Poor  Insight:  Lacking  Psychomotor Activity:  Normal  Concentration:  Concentration: Fair  Recall:  Poor  Fund of Knowledge:  Fair  Language:  Fair  Akathisia:  No  Handed:    AIMS (if indicated):     Assets:  Communication Skills Desire for Improvement Resilience Social Support  ADL's:  Intact  Cognition:  WNL  Sleep:  Number of Hours: 6.75     Treatment Plan Summary: Daily contact with  patient to assess and evaluate symptoms and progress in treatment.  -Admit to inpatient level of care.  -Will continue today 06/17/2018 plan as below except where it is noted.  -Bipolar I, current episode depressed             -Continue depakote DR 750mg  po qhs             -Continue Seroquel 200mg  po qhs  -Anxiety             -Continue vistaril 50mg  po q6h prn anxiety  -Migraines               -Continue imitrex 50mg  po q2h prn migraine  -Insomnia              -Continue trazodone 50mg  po qhs prn insomnia (may repeat x1)  -Encourage participation in groups and therapeutic milieu  -disposition planning will be ongoing   Armandina Stammer, NP, PMHNP, FNP-BC 06/17/2018, 12:58 PM

## 2018-06-17 NOTE — Progress Notes (Addendum)
[facility] Group Notes:  (Nursing/MHT/Case Management/Adjunct)  Date:  06/17/2018  Time:  2045  Type of Therapy:  wrap up group  Participation Level:  Active  Participation Quality:  Appropriate, Attentive, Sharing and Supportive  Affect:  Appropriate  Cognitive:  Appropriate  Insight:  Improving  Engagement in Group:  Engaged and Supportive  Modes of Intervention:  Clarification, Education and Support  Summary of Progress/Problems: Pt shared with group that he found out today that one of his best friends died. Pt reported this being very hard for him but wanting to focus on his recovery even more. Pt is glad he was in here and not on the streets when he found out because he would not handled it as well.   Marcille BuffyMcNeil, Md Smola S 06/17/2018, 9:57 PM

## 2018-06-17 NOTE — Progress Notes (Signed)
D: Pt was in dayroom upon initial approach.  Pt presents with depressed, anxious affect and mood.  Pt reports his day has "been all right, I'm just sleepy."  His goal is to "stay sane."  Pt denies SI/HI, denies hallucinations, denies pain.  Pt has been visible in milieu interacting with peers and staff appropriately.  Pt attended evening group.    A: Introduced self to pt.  Actively listened to pt and offered support and encouragement. Medications administered per order.  PRN medication administered for anxiety and sleep.  Q15 minute safety checks maintained.  R: Pt is safe on the unit.  Pt is compliant with medications.  Pt verbally contracts for safety.  Will continue to monitor and assess.

## 2018-06-17 NOTE — Progress Notes (Signed)
DAR NOTE: Patient presents with flat affect and depressed mood. Pt has been very isolative, stayed in the bed on this shift and only came out for meds and meals. Pt complained of headache, reports fair sleep, good appetite, normal energy, and good concentration. Denies pain, auditory and visual hallucinations.  Rates depression at 2, hopelessness at 2, and anxiety at 0.  Maintained on routine safety checks.  Medications given as prescribed.  Support and encouragement offered as needed. States goal for today is "staying calm and a place to stay when I leave."  Will continue to monitor.

## 2018-06-18 NOTE — Progress Notes (Signed)
[facility] MD Progress Note  06/18/2018 1:17 PM Oluwatosin Higginson  MRN:  [id number]  Subjective: Jeren reports, "I'm doing good so far. I'm feeling tired, a lot of pain to my arms & my head. I have a headache. I have taken some medicine already. I'm not feeling depressed & I have no anxiety. I just did not sleep well last well".  Earl Lites "Bizzy" Weil is a 37 y/o M with history of Bipolar disorder and cocaine use disorder who was [facility] voluntarily from [facility] where he presented with worsening depression, SI with plan to walk into traffic, AH, VH, recent use of cocaine, and poor outpatient medication adherence. Pt was medically cleared and then transferred to [facility] for additional treatment and stabilization. Upon initial presentation, pt shares, "I'm bipolar, and I get these symptoms. I get migraines. It causes me to hallucinate, but then it's causing me to walk into traffic." Pt reports worsening depression with depressed mood, anhedonia, guilty feelings, low energy, poor concentration, and suicidal ideation with plan to walk into traffic. Pt reports distractibility and poor sleep as well. He denies symptoms of OCD and PTSD. He denies HI. He endorses AH of his "name being called." He endorses VH of "a guy standing in the window at [facility]." He denies recent illicit substance use, and when asked about his UDS, pt reports that he was kissing a woman who had been smoking crack cocaine, which is why he believes that cocaine was found in his urine.  Gurdeep is seen, chart reviewed.The chart findings discussed with the treatment team. He presents alert, verbally responsive & able to make his needs known. He presents with a flat affect, not making any eye contact still, He says he is fine, doing good, not feeling depressed or anxious, patient reported. He says he is taking the medicines, denies side effects. He speaks in a monotonic voice. He is complaining of pain to his arms as well as headaches pains. Aengus is reminded  to ask for his prn pain regimen as recommended. He also denies any SIHI, AVH, delusional thoughts or paranoia. He says he does attend afternoon & evening group sessions. He denies any other issues or concerns. He agreed to continue current plan of care already in progress.  Principal Problem: Bipolar affective disorder, current episode mixed, without psychotic features (HCC)  Diagnosis:   Patient Active Problem List   Diagnosis Date Noted  . Bipolar affective disorder, current episode mixed, without psychotic features (HCC) [F31.60] 06/16/2018    Priority: High  . MDD (major depressive disorder), recurrent severe, without psychosis (HCC) [F33.2] 06/15/2018  . MDD (major depressive disorder), recurrent episode, severe (HCC) [F33.2] 06/15/2018  . Suicidal ideation [R45.851]    Total Time spent with patient: 25 minutes  Past Psychiatric History: See H&P  Past Medical History:  Past Medical History:  Diagnosis Date  . Anxiety   . Depression    History reviewed. No pertinent surgical history.  Family History: History reviewed. No pertinent family history.  Family Psychiatric  History: See H&P.  Social History:  Social History   Substance and Sexual Activity  Alcohol Use Yes  . Frequency: Never     Social History   Substance and Sexual Activity  Drug Use Yes  . Types: Cocaine    Social History   Socioeconomic History  . Marital status: Single    Spouse name: Not on file  . Number of children: Not on file  . Years of education: Not on file  . Highest education  level: Not on file  Occupational History  . Not on file  Social Needs  . Financial resource strain: Not on file  . Food insecurity:    Worry: Not on file    Inability: Not on file  . Transportation needs:    Medical: Not on file    Non-medical: Not on file  Tobacco Use  . Smoking status: Current Every Day Smoker    Types: Cigarettes  . Smokeless tobacco: Never Used  Substance and Sexual Activity  .  Alcohol use: Yes    Frequency: Never  . Drug use: Yes    Types: Cocaine  . Sexual activity: Yes  Lifestyle  . Physical activity:    Days per week: Not on file    Minutes per session: Not on file  . Stress: Not on file  Relationships  . Social connections:    Talks on phone: Not on file    Gets together: Not on file    Attends religious service: Not on file    Active member of club or organization: Not on file    Attends meetings of clubs or organizations: Not on file    Relationship status: Not on file  Other Topics Concern  . Not on file  Social History Narrative  . Not on file   Additional Social History:   Sleep: Good  Appetite:  Good  Current Medications: Current Facility-Administered Medications  Medication Dose Route Frequency Provider Last Rate Last Dose  . acetaminophen (TYLENOL) tablet 650 mg  650 mg Oral Q6H PRN Nira Conn A, NP   650 mg at 06/18/18 9604  . divalproex (DEPAKOTE) DR tablet 750 mg  750 mg Oral QHS Armandina Stammer I, NP   750 mg at 06/17/18 2108  . hydrOXYzine (ATARAX/VISTARIL) tablet 50 mg  50 mg Oral Q6H PRN Micheal Likens, MD   50 mg at 06/17/18 2108  . QUEtiapine (SEROQUEL) tablet 200 mg  200 mg Oral QHS Lataja Newland I, NP   200 mg at 06/17/18 2108  . SUMAtriptan (IMITREX) tablet 50 mg  50 mg Oral Q2H PRN Armandina Stammer I, NP   50 mg at 06/17/18 1844  . traZODone (DESYREL) tablet 50 mg  50 mg Oral QHS PRN,MR X 1 Micheal Likens, MD   50 mg at 06/17/18 2108   Lab Results: No results found for this or any previous visit (from the past 48 hour(s)).  Blood Alcohol level:  Lab Results  Component Value Date   ETH <10 06/14/2018   Metabolic Disorder Labs: No results found for: HGBA1C, MPG No results found for: PROLACTIN No results found for: CHOL, TRIG, HDL, CHOLHDL, VLDL, LDLCALC  Physical Findings: AIMS: Facial and Oral Movements Muscles of Facial Expression: None, normal Lips and Perioral Area: None, normal Jaw: None,  normal Tongue: None, normal,Extremity Movements Upper (arms, wrists, hands, fingers): None, normal Lower (legs, knees, ankles, toes): None, normal, Trunk Movements Neck, shoulders, hips: None, normal, Overall Severity Severity of abnormal movements (highest score from questions above): None, normal Incapacitation due to abnormal movements: None, normal Patient's awareness of abnormal movements (rate only patient's report): No Awareness, Dental Status Current problems with teeth and/or dentures?: No Does patient usually wear dentures?: No  CIWA:    COWS:     Musculoskeletal: Strength & Muscle Tone: within normal limits Gait & Station: normal Patient leans: N/A  Psychiatric Specialty Exam: Physical Exam  Nursing note and vitals reviewed. Constitutional: He appears well-developed and well-nourished.    Review  of Systems  Psychiatric/Behavioral: Positive for depression ("Improving") and substance abuse (Hx. Cocaine use disorder). Negative for hallucinations (Hx. psychosis), memory loss and suicidal ideas. The patient is not nervous/anxious and does not have insomnia.     Blood pressure 123/76, pulse (!) 118, temperature 99 F (37.2 C), temperature source Oral, resp. rate 16, height 6' (1.829 m), weight 75.8 kg.Body mass index is 22.65 kg/m.  General Appearance: Casual and Fairly Groomed  Eye Contact:  Good  Speech:  Clear and Coherent and Normal Rate  Volume:  Normal  Mood:  Anxious and Depressed  Affect:  Appropriate, Congruent and Constricted  Thought Process:  Coherent and Goal Directed  Orientation:  Full (Time, Place, and Person)  Thought Content:  Delusions, Hallucinations: Auditory Visual and Paranoid Ideation  Suicidal Thoughts:  Yes.  with intent/plan  Homicidal Thoughts:  No  Memory:  Immediate;   Fair Recent;   Fair Remote;   Fair  Judgement:  Poor  Insight:  Lacking  Psychomotor Activity:  Normal  Concentration:  Concentration: Fair  Recall:  Poor  Fund of  Knowledge:  Fair  Language:  Fair  Akathisia:  No  Handed:    AIMS (if indicated):     Assets:  Communication Skills Desire for Improvement Resilience Social Support  ADL's:  Intact  Cognition:  WNL  Sleep:  Number of Hours: 6.75     Treatment Plan Summary: Daily contact with patient to assess and evaluate symptoms and progress in treatment.  -Admit to inpatient level of care.  -Will continue today 06/18/2018 plan as below except where it is noted.  -Bipolar I, current episode depressed             -Continue depakote DR 750mg  po qhs             -Continue Seroquel 200mg  po  Q hs.             - Will obtain Depakote level on 06-20-18.  -Anxiety             -Continue vistaril 50mg  po q6h prn anxiety  -Migraines               -Continue imitrex 50mg  po q2h prn migraine  -Insomnia              -Continue trazodone 50mg  po qhs prn insomnia (may repeat x1)  -Encourage participation in groups and therapeutic milieu  -disposition planning will be ongoing   Armandina StammerAgnes Alize Acy, NP, PMHNP, FNP-BC 06/18/2018, 1:17 PMPatient ID: Randa EvensGregory Coster, male   DOB: 06/21/1981, 37 y.o.   MRN: [id number]

## 2018-06-18 NOTE — Plan of Care (Signed)
  Problem: Activity: Goal: Sleeping patterns will improve Outcome: Progressing Note:  Slept 6.75 hours last night per flowsheet.    

## 2018-06-18 NOTE — [facility] Group Notes (Signed)
LCSW Group Therapy Note  06/18/2018 1:15pm  Type of Therapy and Topic:  Group Therapy:  Feelings around Relapse and Recovery  Participation Level:  Did Not Attend--pt invited. Chose to remain in bed.    Description of Group:    Patients in this group will discuss emotions they experience before and after a relapse. They will process how experiencing these feelings, or avoidance of experiencing them, relates to having a relapse. Facilitator will guide patients to explore emotions they have related to recovery. Patients will be encouraged to process which emotions are more powerful. They will be guided to discuss the emotional reaction significant others in their lives may have to their relapse or recovery. Patients will be assisted in exploring ways to respond to the emotions of others without this contributing to a relapse.  Therapeutic Goals: 1. Patient will identify two or more emotions that lead to a relapse for them 2. Patient will identify two emotions that result when they relapse 3. Patient will identify two emotions related to recovery 4. Patient will demonstrate ability to communicate their needs through discussion and/or role plays   Summary of Patient Progress:     Therapeutic Modalities:   Cognitive Behavioral Therapy Solution-Focused Therapy Assertiveness Training Relapse Prevention Therapy   Rona RavensHeather S Cyprian Gongaware, LCSW 06/18/2018 12:59 PM

## 2018-06-18 NOTE — Progress Notes (Signed)
DAR NOTE: Patient presents with flat affect and depressed mood. Pt has been very isolative, stayed in the bed on this shift. Pt did not get for meds nor meals. Pt complained of headache but did not got up for anything. P reports poor sleep, good appetite, low energy, and poor concentration. Denies auditory and visual hallucinations.  Rates depression at 0, hopelessness at 0, and anxiety at 0.  Maintained on routine safety checks.  Medications given as prescribed.  Support and encouragement offered as needed. Will continue to monitor.

## 2018-06-18 NOTE — Progress Notes (Signed)
Patient did attend the evening speaker AA meeting.  

## 2018-06-19 DIAGNOSIS — F1490 Cocaine use, unspecified, uncomplicated: Secondary | ICD-10-CM

## 2018-06-19 MED ORDER — RAMELTEON 8 MG PO TABS
8.0000 mg | ORAL_TABLET | Freq: Every day | ORAL | Status: DC
  Administered 2018-06-19 – 2018-06-21 (×3): 8 mg via ORAL
  Filled 2018-06-19 (×2): qty 1
  Filled 2018-06-19: qty 7
  Filled 2018-06-19 (×2): qty 1
  Filled 2018-06-19: qty 7

## 2018-06-19 MED ORDER — GABAPENTIN 300 MG PO CAPS
300.0000 mg | ORAL_CAPSULE | Freq: Three times a day (TID) | ORAL | Status: DC
  Administered 2018-06-19 – 2018-06-22 (×8): 300 mg via ORAL
  Filled 2018-06-19: qty 1
  Filled 2018-06-19 (×2): qty 21
  Filled 2018-06-19 (×5): qty 1
  Filled 2018-06-19 (×3): qty 21
  Filled 2018-06-19: qty 1
  Filled 2018-06-19: qty 21
  Filled 2018-06-19: qty 1

## 2018-06-19 MED ORDER — DIVALPROEX SODIUM 500 MG PO DR TAB
500.0000 mg | DELAYED_RELEASE_TABLET | Freq: Two times a day (BID) | ORAL | Status: DC
  Administered 2018-06-19 – 2018-06-22 (×6): 500 mg via ORAL
  Filled 2018-06-19: qty 1
  Filled 2018-06-19: qty 14
  Filled 2018-06-19 (×4): qty 1
  Filled 2018-06-19 (×3): qty 14
  Filled 2018-06-19: qty 1

## 2018-06-19 MED ORDER — IBUPROFEN 800 MG PO TABS
800.0000 mg | ORAL_TABLET | Freq: Four times a day (QID) | ORAL | Status: DC | PRN
  Administered 2018-06-19 – 2018-06-21 (×4): 800 mg via ORAL
  Filled 2018-06-19 (×4): qty 1

## 2018-06-19 NOTE — [facility] Group Notes (Signed)
[facility] Group Notes:  (Nursing)  Date:  06/19/2018  Time: 1:15 Pm Type of Therapy:  Nurse Education  Participation Level:  Active  Participation Quality:  Appropriate  Affect:  Appropriate  Cognitive:  Appropriate  Insight:  Appropriate  Engagement in Group:  Engaged  Modes of Intervention:  Discussion and Education  Summary of Progress/Problems: Nurse-led group discussion on anger management  Shela NevinValerie S Phyllip Claw 06/19/2018, 3:55 PM

## 2018-06-19 NOTE — [facility] Group Notes (Signed)
[facility] Group Notes:  (Nursing/MHT/Case Management/Adjunct)  Date:  06/19/2018  Time:  12:27 PM  Type of Therapy:  Psychoeducational Skills  Participation Level:  Did Not Attend  Participation Quality:  DID NOT ATTEND  Affect:  DID NOT ATTEND  Cognitive:  DID NOT ATTEND  Insight:  None  Engagement in Group:  DID NOT ATTEND  Modes of Intervention:  DID NOT ATTEND  Summary of Progress/Problems: Pt did not attend patient self inventory group.    Austin Doyle 06/19/2018, 12:27 PM 

## 2018-06-19 NOTE — [facility] Group Notes (Signed)
LCSW Group Therapy Note  06/19/2018   10:00-11:00am   Type of Therapy and Topic:  Group Therapy: Anger Cues and Responses  Participation Level:  Did Not Attend   Description of Group:   In this group, patients learned how to recognize the physical, cognitive, emotional, and behavioral responses they have to anger-provoking situations.  They identified a recent time they became angry and how they reacted.  They analyzed how their reaction was possibly beneficial and how it was possibly unhelpful.  The group discussed a variety of healthier coping skills that could help with such a situation in the future.  Deep breathing was practiced briefly.  Therapeutic Goals: 1. Patients will remember their last incident of anger and how they felt emotionally and physically, what their thoughts were at the time, and how they behaved. 2. Patients will identify how their behavior at that time worked for them, as well as how it worked against them. 3. Patients will explore possible new behaviors to use in future anger situations. 4. Patients will learn that anger itself is normal and cannot be eliminated, and that healthier reactions can assist with resolving conflict rather than worsening situations.  Summary of Patient Progress:  Did not attend  Therapeutic Modalities:   Cognitive Behavioral Therapy  Brittinie Wherley D Jerzie Bieri    

## 2018-06-19 NOTE — Progress Notes (Signed)
Patient ID: Austin EvensGregory Bekker, male   DOB: 1981/08/10, 37 y.o.   MRN: [id number]   D: Pt has been very flat and depressed on the unit today. Pt has also been very agitated and irritable. Pt reported that he needed someone to fix his medication. Pt reported that he was having severe mood swings, not sleeping, and in pain. Catha NottinghamJamison NP made aware new orders noted. Pt reported that his depression was a 5, his hopelessness was a 5, and his anxiety was a 1. Pt reported that his goal for today was to work on attitude and bad thoughts which will keep headaches down. Pt reported being negative SI/HI, no AH/VH noted. A: 15 min checks continued for patient safety. R: Pt safety maintained.

## 2018-06-19 NOTE — Progress Notes (Addendum)
Patient ID: Austin Doyle, male   DOB: 02-05-1981, 37 y.o.   MRN: [id number] [facility] MD Progress Note  06/19/2018 2:16 PM Austin Doyle  MRN:  [id number]  Subjective: Austin Doyle reports, "I'm doing good so far. I'm feeling tired, a lot of pain to my arms & my head. I have a headache. I have taken some medicine already. I'm not feeling depressed & I have no anxiety. I just did not sleep well last well".  Austin Doyle is a 37 y/o M with history of Bipolar disorder and cocaine use disorder who [facility] where he presented with worsening depression, SI with plan to walk into traffic, AH, VH, recent use of cocaine, and poor outpatient medication adherence. Pt was medically cleared and then transferred to [facility] for additional treatment and stabilization. Upon initial presentation, pt shares, "I'm bipolar, and I get these symptoms. I get migraines. It causes me to hallucinate, but then it's causing me to walk into traffic." Pt reports worsening depression with depressed mood, anhedonia, guilty feelings, low energy, poor concentration, and suicidal ideation with plan to walk into traffic. Pt reports distractibility and poor sleep as well. He denies symptoms of OCD and PTSD. He denies HI. He endorses AH of his "name being called." He endorses VH of "a guy standing in the window at [facility]." He denies recent illicit substance use, and when asked about his UDS, pt reports that he was kissing a woman who had been smoking crack cocaine, which is why he believes that cocaine was found in his urine.  Austin Doyle is seen, chart reviewed.The chart findings discussed with the treatment team. He presents alert, verbally responsive & able to make his needs known. He presents with a flat affect, not making any eye contact still, He says he is fine, doing good, not feeling depressed or anxious, patient reported. He says he is taking the medicines, denies side effects. He speaks in a monotonic voice. He is  complaining of pain to his arms as well as headaches pains. Austin Doyle is reminded to ask for his prn pain regimen as recommended. He also denies any SIHI, AVH, delusional thoughts or paranoia. He says he does attend afternoon & evening group sessions. He denies any other issues or concerns. He agreed to continue current plan of care already in progress.  Principal Problem: Bipolar affective disorder, current episode mixed, without psychotic features (HCC)  Diagnosis:   Patient Active Problem List   Diagnosis Date Noted  . Bipolar affective disorder, current episode mixed, without psychotic features (HCC) [F31.60] 06/16/2018  . MDD (major depressive disorder), recurrent severe, without psychosis (HCC) [F33.2] 06/15/2018  . MDD (major depressive disorder), recurrent episode, severe (HCC) [F33.2] 06/15/2018  . Suicidal ideation [R45.851]    Total Time spent with patient: 25 minutes  Past Psychiatric History: See H&P  Past Medical History:  Past Medical History:  Diagnosis Date  . Anxiety   . Depression    History reviewed. No pertinent surgical history.  Family History: History reviewed. No pertinent family history.  Family Psychiatric  History: See H&P.  Social History:  Social History   Substance and Sexual Activity  Alcohol Use Yes  . Frequency: Never     Social History   Substance and Sexual Activity  Drug Use Yes  . Types: Cocaine    Social History   Socioeconomic History  . Marital status: Single    Spouse name: Not on file  . Number of children: Not on file  .  Years of education: Not on file  . Highest education level: Not on file  Occupational History  . Not on file  Social Needs  . Financial resource strain: Not on file  . Food insecurity:    Worry: Not on file    Inability: Not on file  . Transportation needs:    Medical: Not on file    Non-medical: Not on file  Tobacco Use  . Smoking status: Current Every Day Smoker    Types: Cigarettes  . Smokeless  tobacco: Never Used  Substance and Sexual Activity  . Alcohol use: Yes    Frequency: Never  . Drug use: Yes    Types: Cocaine  . Sexual activity: Yes  Lifestyle  . Physical activity:    Days per week: Not on file    Minutes per session: Not on file  . Stress: Not on file  Relationships  . Social connections:    Talks on phone: Not on file    Gets together: Not on file    Attends religious service: Not on file    Active member of club or organization: Not on file    Attends meetings of clubs or organizations: Not on file    Relationship status: Not on file  Other Topics Concern  . Not on file  Social History Narrative  . Not on file   Additional Social History:   Sleep: Good  Appetite:  Good  Current Medications: Current Facility-Administered Medications  Medication Dose Route Frequency Provider Last Rate Last Dose  . divalproex (DEPAKOTE) DR tablet 500 mg  500 mg Oral Q12H Zahlia Deshazer, Herminio Heads, NP      . gabapentin (NEURONTIN) capsule 300 mg  300 mg Oral TID Charm Rings, NP      . hydrOXYzine (ATARAX/VISTARIL) tablet 50 mg  50 mg Oral Q6H PRN Micheal Likens, MD   50 mg at 06/17/18 2108  . ibuprofen (ADVIL,MOTRIN) tablet 800 mg  800 mg Oral Q6H PRN Charm Rings, NP      . QUEtiapine (SEROQUEL) tablet 200 mg  200 mg Oral QHS Armandina Stammer I, NP   200 mg at 06/18/18 2234  . ramelteon (ROZEREM) tablet 8 mg  8 mg Oral QHS Charm Rings, NP      . SUMAtriptan (IMITREX) tablet 50 mg  50 mg Oral Q2H PRN Armandina Stammer I, NP   50 mg at 06/18/18 1948   Lab Results: No results found for this or any previous visit (from the past 48 hour(s)).  Blood Alcohol level:  Lab Results  Component Value Date   ETH <10 06/14/2018   Metabolic Disorder Labs: No results found for: HGBA1C, MPG No results found for: PROLACTIN No results found for: CHOL, TRIG, HDL, CHOLHDL, VLDL, LDLCALC  Physical Findings: AIMS: Facial and Oral Movements Muscles of Facial Expression: None,  normal Lips and Perioral Area: None, normal Jaw: None, normal Tongue: None, normal,Extremity Movements Upper (arms, wrists, hands, fingers): None, normal Lower (legs, knees, ankles, toes): None, normal, Trunk Movements Neck, shoulders, hips: None, normal, Overall Severity Severity of abnormal movements (highest score from questions above): None, normal Incapacitation due to abnormal movements: None, normal Patient's awareness of abnormal movements (rate only patient's report): No Awareness, Dental Status Current problems with teeth and/or dentures?: No Does patient usually wear dentures?: No  CIWA:    COWS:     Musculoskeletal: Strength & Muscle Tone: within normal limits Gait & Station: normal Patient leans: N/A  Psychiatric Specialty Exam:  Physical Exam  Nursing note and vitals reviewed. Constitutional: He is oriented to person, place, and time. He appears well-developed and well-nourished.  HENT:  Head: Normocephalic.  Neck: Normal range of motion.  Cardiovascular: Normal rate.  Respiratory: Effort normal.  Musculoskeletal: Normal range of motion.  Neurological: He is alert and oriented to person, place, and time.  Psychiatric: His speech is normal and behavior is normal. Judgment and thought content normal. His mood appears anxious. Cognition and memory are normal. He exhibits a depressed mood.    Review of Systems  Psychiatric/Behavioral: Positive for depression ("Improving") and substance abuse (Hx. Cocaine use disorder). Negative for hallucinations (Hx. psychosis), memory loss and suicidal ideas. The patient is not nervous/anxious and does not have insomnia.   All other systems reviewed and are negative.   Blood pressure 128/86, pulse (!) 101, temperature 98.4 F (36.9 C), temperature source Oral, resp. rate 16, height 6' (1.829 m), weight 75.8 kg.Body mass index is 22.65 kg/m.  General Appearance: Casual and Fairly Groomed  Eye Contact:  Good  Speech:  Clear and  Coherent and Normal Rate  Volume:  Normal  Mood:  Anxious and Depressed  Affect:  Appropriate, Congruent and Constricted  Thought Process:  Coherent and Goal Directed  Orientation:  Full (Time, Place, and Person)  Thought Content:  Delusions, Hallucinations: Auditory Visual and Paranoid Ideation  Suicidal Thoughts:  Yes.  with intent/plan  Homicidal Thoughts:  No  Memory:  Immediate;   Fair Recent;   Fair Remote;   Fair  Judgement:  Poor  Insight:  Lacking  Psychomotor Activity:  Normal  Concentration:  Concentration: Fair  Recall:  Poor  Fund of Knowledge:  Fair  Language:  Fair  Akathisia:  No  Handed:    AIMS (if indicated):     Assets:  Communication Skills Desire for Improvement Resilience Social Support  ADL's:  Intact  Cognition:  WNL  Sleep:  Number of Hours: 6.75     Treatment Plan Summary: Daily contact with patient to assess and evaluate symptoms and progress in treatment.  -Admit to inpatient level of care.  -Will continue today 06/19/2018 plan as below except where it is noted.  -Bipolar I, current episode depressed             -Increased depakote DR 750mg  po qhs to 500 mg BID             -Continue Seroquel 200mg  po  Q hs.  -Anxiety             -Discontinue vistaril 50mg  po q6h prn anxiety             -Start gabapentin 300 mg TID   -Migraines               -Continue imitrex 50mg  po q2h prn migraine              -Start ibuprofen 800 mg every six hours PRN   -Insomnia              -Discontinue trazodone 50mg  po qhs prn insomnia (may repeat x1)             -Start Rozerem 8 mg daily at bedtime  -Encourage participation in groups and therapeutic milieu  -disposition planning will be ongoing   Nanine MeansLORD, Austin Utsey, NP, PMHNP, FNP-BC 06/19/2018, 2:16 PMPatient ID: Austin EvensGregory Doyle, male   DOB: 1981-10-03, 37 y.o.   MRN: [id number]

## 2018-06-19 NOTE — Progress Notes (Signed)
Writer has observed patient up in the dayroom watching tv and interacting with peers appropriately. He attended group. Writer spoke with him 1:1 and he reports that he currently does not have goal but would like to check on getting into a treatment facility after discharge. He c/o his left arm being sore. He had his flu shot on Tuesday/Wednesday and his arm is sore and warm to the touch. He had received vistaril and motrin on previous shift. Writer gave him an ice pack to keep on his arm. He will receive another dose of vistaril with hs dose of medications. Support given and safety maintained on unit with 15 min checks.

## 2018-06-19 NOTE — [facility] Group Notes (Signed)
[facility] Group Notes:  (Nursing/MHT/Case Management/Adjunct)  Date:  06/19/2018  Time:  9:18 AM  Type of Therapy:  Orientation/Goals group  Participation Level:  Did Not Attend  Participation Quality:  Did Not Attend  Affect:  Did Not Attend  Cognitive:  Did Not Attend  Insight:  None  Engagement in Group:  Did Not Attend  Modes of Intervention:  Did Not Attend  Summary of Progress/Problems: Pt did not attend patient self inventory group/orientation group.   Jacquelyne BalintForrest, Katelee Schupp Shanta 06/19/2018, 9:18 AM

## 2018-06-19 NOTE — Progress Notes (Signed)
D: Patient observed up and around milieu. Patient initially withdrawn however later observed interacting with peers during snack time. Had indicated to this writer he wanted his bedtime meds right at 2100 but later elected to stay up longer in dayroom with fellow patients. Patient forwards minimal information and can be vague at times. Patient's affect flat, anxious with congruent mood. Complained of a migraine headache which he rated at a 7/10. No other physical complaints.   A: Medicated per orders, prn imitrex po given for discomfort. Medication education provided. Level III obs in place for safety. Emotional support offered. Patient encouraged to complete Suicide Safety Plan before discharge. Encouraged to attend and participate in unit programming.    R: Patient verbalizes understanding of POC. On reassess, patient states h/a is resolved to a 0/10. Patient denies SI/HI/AVH and remains safe on level III obs. Will continue to monitor throughout the night.

## 2018-06-19 NOTE — Plan of Care (Signed)
  Problem: Activity: Goal: Interest or engagement in activities will improve Outcome: Progressing Patient has been isolative per day RN today however observed to be out and social with peers in dayroom this evening.   Problem: Safety: Goal: Periods of time without injury will increase Outcome: Progressing Patient has not engaged in self harm and denies thoughts to do so.

## 2018-06-20 NOTE — Progress Notes (Signed)
[facility] MD Progress Note  06/20/2018 1:13 PM Austin Doyle  MRN:  [id number]  Subjective: Austin Doyle reports, "I'm fine. The depression is getting better. Why are you guys keep waking me up?"  Austin Doyle is a 37 y/o M with history of Bipolar disorder and cocaine use disorder who was admitted voluntarily from [facility] where he presented with worsening depression, SI with plan to walk into traffic, AH, VH, recent use of cocaine, and poor outpatient medication adherence. Pt was medically cleared and then transferred to [facility] for additional treatment and stabilization. Upon initial presentation, pt shares, "I'm bipolar, and I get these symptoms. I get migraines. It causes me to hallucinate, but then it's causing me to walk into traffic." Pt reports worsening depression with depressed mood, anhedonia, guilty feelings, low energy, poor concentration, and suicidal ideation with plan to walk into traffic. Pt reports distractibility and poor sleep as well. He denies symptoms of OCD and PTSD. He denies HI. He endorses AH of his "name being called." He endorses VH of "a guy standing in the window at [facility]." He denies recent illicit substance use, and when asked about his UDS, pt reports that he was kissing a woman who had been smoking crack cocaine, which is why he believes that cocaine was found in his urine.  [name] is seen, chart reviewed.The chart findings discussed with the treatment team. He presents alert, verbally responsive & able to make his needs known. He presents with a flat affect, not making any eye contact still, He says his depression is getting better. He asked why the staff keep waking him up. He gets up to get his medications & to go to the cafeteria to eat. He is not very active in the group sessions. He is taking the medicines, denies side effects. He speaks in a monotonic voice. He currently denies any SIHI, AVH, delusional thoughts or paranoia. He says he does attend afternoon & evening group  sessions, just the morning sessions. He denies any other issues or concerns. He agreed to continue current plan of care already in progress.  Principal Problem: Bipolar affective disorder, current episode mixed, without psychotic features (HCC)  Diagnosis:   Patient Active Problem List   Diagnosis Date Noted  . Bipolar affective disorder, current episode mixed, without psychotic features (HCC) [F31.60] 06/16/2018    Priority: High  . Suicidal ideation [R45.851]    Total Time spent with patient: 15 minutes  Past Psychiatric History: See H&P  Past Medical History:  Past Medical History:  Diagnosis Date  . Anxiety   . Depression    History reviewed. No pertinent surgical history.  Family History: History reviewed. No pertinent family history.  Family Psychiatric  History: See H&P.  Social History:  Social History   Substance and Sexual Activity  Alcohol Use Yes  . Frequency: Never     Social History   Substance and Sexual Activity  Drug Use Yes  . Types: Cocaine    Social History   Socioeconomic History  . Marital status: Single    Spouse name: Not on file  . Number of children: Not on file  . Years of education: Not on file  . Highest education level: Not on file  Occupational History  . Not on file  Social Needs  . Financial resource strain: Not on file  . Food insecurity:    Worry: Not on file    Inability: Not on file  . Transportation needs:    Medical: Not on file  Non-medical: Not on file  Tobacco Use  . Smoking status: Current Every Day Smoker    Types: Cigarettes  . Smokeless tobacco: Never Used  Substance and Sexual Activity  . Alcohol use: Yes    Frequency: Never  . Drug use: Yes    Types: Cocaine  . Sexual activity: Yes  Lifestyle  . Physical activity:    Days per week: Not on file    Minutes per session: Not on file  . Stress: Not on file  Relationships  . Social connections:    Talks on phone: Not on file    Gets together: Not on  file    Attends religious service: Not on file    Active member of club or organization: Not on file    Attends meetings of clubs or organizations: Not on file    Relationship status: Not on file  Other Topics Concern  . Not on file  Social History Narrative  . Not on file   Additional Social History:   Sleep: Good  Appetite:  Good  Current Medications: Current Facility-Administered Medications  Medication Dose Route Frequency Provider Last Rate Last Dose  . divalproex (DEPAKOTE) DR tablet 500 mg  500 mg Oral Q12H Charm Rings, NP   500 mg at 06/20/18 0739  . gabapentin (NEURONTIN) capsule 300 mg  300 mg Oral TID Charm Rings, NP   300 mg at 06/20/18 1148  . hydrOXYzine (ATARAX/VISTARIL) tablet 50 mg  50 mg Oral Q6H PRN Micheal Likens, MD   50 mg at 06/19/18 2229  . ibuprofen (ADVIL,MOTRIN) tablet 800 mg  800 mg Oral Q6H PRN Charm Rings, NP   800 mg at 06/20/18 0151  . QUEtiapine (SEROQUEL) tablet 200 mg  200 mg Oral QHS Nwoko, Agnes I, NP   200 mg at 06/19/18 2229  . ramelteon (ROZEREM) tablet 8 mg  8 mg Oral QHS Charm Rings, NP   8 mg at 06/19/18 2229  . SUMAtriptan (IMITREX) tablet 50 mg  50 mg Oral Q2H PRN Armandina Stammer I, NP   50 mg at 06/18/18 1948   Lab Results: No results found for this or any previous visit (from the past 48 hour(s)).  Blood Alcohol level:  Lab Results  Component Value Date   ETH <10 06/14/2018   Metabolic Disorder Labs: No results found for: HGBA1C, MPG No results found for: PROLACTIN No results found for: CHOL, TRIG, HDL, CHOLHDL, VLDL, LDLCALC  Physical Findings: AIMS: Facial and Oral Movements Muscles of Facial Expression: None, normal Lips and Perioral Area: None, normal Jaw: None, normal Tongue: None, normal,Extremity Movements Upper (arms, wrists, hands, fingers): None, normal Lower (legs, knees, ankles, toes): None, normal, Trunk Movements Neck, shoulders, hips: None, normal, Overall Severity Severity of abnormal  movements (highest score from questions above): None, normal Incapacitation due to abnormal movements: None, normal Patient's awareness of abnormal movements (rate only patient's report): No Awareness, Dental Status Current problems with teeth and/or dentures?: No Does patient usually wear dentures?: No  CIWA:    COWS:     Musculoskeletal: Strength & Muscle Tone: within normal limits Gait & Station: normal Patient leans: N/A  Psychiatric Specialty Exam: Physical Exam  Nursing note and vitals reviewed. Constitutional: He appears well-developed and well-nourished.    Review of Systems  Psychiatric/Behavioral: Positive for depression ("Improving") and substance abuse (Hx. Cocaine use disorder). Negative for hallucinations (Hx. psychosis), memory loss and suicidal ideas. The patient is not nervous/anxious and does not have insomnia.  Blood pressure 128/86, pulse (!) 101, temperature 98.4 F (36.9 C), temperature source Oral, resp. rate 16, height 6' (1.829 m), weight 75.8 kg.Body mass index is 22.65 kg/m.  General Appearance: Casual and Fairly Groomed  Eye Contact:  Good  Speech:  Clear and Coherent and Normal Rate  Volume:  Normal  Mood:  Anxious and Depressed  Affect:  Appropriate, Congruent and Constricted  Thought Process:  Coherent and Goal Directed  Orientation:  Full (Time, Place, and Person)  Thought Content:  Delusions, Hallucinations: Auditory Visual and Paranoid Ideation  Suicidal Thoughts:  Yes.  with intent/plan  Homicidal Thoughts:  No  Memory:  Immediate;   Fair Recent;   Fair Remote;   Fair  Judgement:  Poor  Insight:  Lacking  Psychomotor Activity:  Normal  Concentration:  Concentration: Fair  Recall:  Poor  Fund of Knowledge:  Fair  Language:  Fair  Akathisia:  No  Handed:    AIMS (if indicated):     Assets:  Communication Skills Desire for Improvement Resilience Social Support  ADL's:  Intact  Cognition:  WNL  Sleep:  Number of Hours: 6.75      Treatment Plan Summary: Daily contact with patient to assess and evaluate symptoms and progress in treatment.  -Admit to inpatient level of care.  -Will continue today 06/20/2018 plan as below except where it is noted.  -Bipolar I, current episode depressed             -Continue depakote DR 750mg  po qhs             -Continue Seroquel 200mg  po  Q hs.             - Will obtain Depakote level on 06-20-18, result pending.  -Anxiety             -Continue vistaril 50mg  po q6h prn anxiety  -Migraines               -Continue imitrex 50mg  po q2h prn migraine  -Insomnia              -Continue trazodone 50mg  po qhs prn insomnia (may repeat x1)  -Encourage participation in groups and therapeutic milieu  -disposition planning will be ongoing   Armandina StammerAgnes Nwoko, NP, PMHNP, FNP-BC 06/20/2018, 1:13 PMPatient ID: Austin EvensGregory Doyle, male   DOB: 12-05-1980, 37 y.o.

## 2018-06-20 NOTE — Progress Notes (Signed)
Writer has observed patient up in the dayroom laughing and talking with peers. He later came up and reported that his arm was starting to bother him again. He received vistaril and motrin to help with pain and itching. Support given and safety maintained on unit with 15 min checks.

## 2018-06-20 NOTE — [facility] Group Notes (Signed)
Pt was alert and engaged in therapeutic relaxation group. 

## 2018-06-20 NOTE — Progress Notes (Signed)
Patient ID: Austin Doyle, male   DOB: 1980/12/16, 37 y.o.   MRN: [id number]  D: Patient reports a poor sleep quality last night, reports a good appetite, reports his energy level as being normal, and reports his concentration level as poor.  Pt rates his depression as 5 (10 being the worst), rates his hopelessness level as 6 (10 being the worst), and rates his anxiety level today as 1 (10 being the worst).  Pt denies SI/HI/AVH.  Pt reports that his goal for today is to try to stay focused and to keep his thoughts from "scribling".  Pt also states that he is hoping to "ignore my demons" today.  A: Pt is currently being monitored on Q15 minute checks for safety, and is being given all meds as scheduled.  R: Will continue to monitor on Q15 minute checks.

## 2018-06-20 NOTE — Progress Notes (Signed)
Patient ID: Randa EvensGregory Doyle, male   DOB: 1981-04-07, 37 y.o.   [id number] [facility]  (Nursing/MHT/Case Management/Adjunct)  Date:  06/20/2018  Time:  3:16 PM  Type of Therapy:  Played a non-competitive learning/communication board game that fosters learning skills as well as self expression.  Participation Level:  Attended and participated.  Participation Quality:  Good  Affect: WNL  Cognitive:  WNL  Insight:  WNL  Engagement in Group:  WNL  Modes of Intervention:  WNL  Summary of Progress/Problems: Pt participated appropriately during group activity.  Arina Torry 06/20/2018, 3:16 PM

## 2018-06-20 NOTE — [facility] Group Notes (Signed)
Pt was invited but did not attend orientation/goals group. 

## 2018-06-20 NOTE — [facility] Group Notes (Signed)
[facility] LCSW Group Therapy Note  Date/Time:  06/20/2018 9:00-10:00 or 10:00-11:00AM  Type of Therapy and Topic:  Group Therapy:  Healthy and Unhealthy Supports  Participation Level:  Did Not Attend   Description of Group:  Patients in this group were introduced to the idea of adding a variety of healthy supports to address the various needs in their lives.Patients discussed what additional healthy supports could be helpful in their recovery and wellness after discharge in order to prevent future hospitalizations.   An emphasis was placed on using counselor, doctor, therapy groups, 12-step groups, and problem-specific support groups to expand supports.  They also worked as a group on developing a specific plan for several patients to deal with unhealthy supports through boundary-setting, psychoeducation with loved ones, and even termination of relationships.   Therapeutic Goals:   1)  discuss importance of adding supports to stay well once out of the hospital  2)  compare healthy versus unhealthy supports and identify some examples of each  3)  generate ideas and descriptions of healthy supports that can be added  4)  offer mutual support about how to address unhealthy supports  5)  encourage active participation in and adherence to discharge plan    Summary of Patient Progress:   Did not attend Therapeutic Modalities:   Motivational Interviewing Brief Solution-Focused Therapy  Evorn Gongonnie D Khristina Janota

## 2018-06-21 MED ORDER — GABAPENTIN 300 MG PO CAPS: 300.0000 mg | ORAL_CAPSULE | Freq: Three times a day (TID) | ORAL | 0 refills | Status: AC

## 2018-06-21 MED ORDER — RAMELTEON 8 MG PO TABS: 8.0000 mg | ORAL_TABLET | Freq: Every day | ORAL | 0 refills | Status: AC

## 2018-06-21 MED ORDER — QUETIAPINE FUMARATE 200 MG PO TABS: 200.0000 mg | ORAL_TABLET | Freq: Every day | ORAL | 0 refills | Status: AC

## 2018-06-21 MED ORDER — DIVALPROEX SODIUM 500 MG PO DR TAB: 500.0000 mg | DELAYED_RELEASE_TABLET | Freq: Two times a day (BID) | ORAL | 0 refills | Status: AC

## 2018-06-21 NOTE — Progress Notes (Addendum)
D:  Patient denied SI and HI, contracts for safety.  Denied A/V hallucinations.  Denied pain. A:  Medications administered per MD orders.  Emotional support and encouragement given patient. R:  Safety maintained with 15 minute checks.  Self inventory sheet, patient had poor sleep, sleep medication not helpful.  Good appetite, normal energy level, good concentration.  Rated anxiety, depression and hopeless 3.  Denied withdrawals  Denied SI.  Denied physical problems.  Physical pain, medication is helpful.  Goal is stay focused.  Plans to stay calm.  Does have discharge plans.  Voices in my head.

## 2018-06-21 NOTE — Tx Team (Signed)
Interdisciplinary Treatment and Diagnostic Plan Update  06/21/2018 Time of Session: 0830AM Austin Doyle MRN: [id number]  Principal Diagnosis: MDD, recurrent, severe  Secondary Diagnoses: Principal Problem:   Bipolar affective disorder, current episode mixed, without psychotic features (HCC) Active Problems:   Suicidal ideation   Current Medications:  Current Facility-Administered Medications  Medication Dose Route Frequency Provider Last Rate Last Dose  . divalproex (DEPAKOTE) DR tablet 500 mg  500 mg Oral Q12H Charm Rings, NP   500 mg at 06/21/18 2952  . gabapentin (NEURONTIN) capsule 300 mg  300 mg Oral TID Charm Rings, NP   300 mg at 06/21/18 0813  . hydrOXYzine (ATARAX/VISTARIL) tablet 50 mg  50 mg Oral Q6H PRN Micheal Likens, MD   50 mg at 06/21/18 0814  . ibuprofen (ADVIL,MOTRIN) tablet 800 mg  800 mg Oral Q6H PRN Charm Rings, NP   800 mg at 06/21/18 0815  . QUEtiapine (SEROQUEL) tablet 200 mg  200 mg Oral QHS Nwoko, Agnes I, NP   200 mg at 06/20/18 2215  . ramelteon (ROZEREM) tablet 8 mg  8 mg Oral QHS Charm Rings, NP   8 mg at 06/20/18 2215  . SUMAtriptan (IMITREX) tablet 50 mg  50 mg Oral Q2H PRN Armandina Stammer I, NP   50 mg at 06/18/18 1948   PTA Medications: No medications prior to admission.    Patient Stressors: Financial difficulties Medication change or noncompliance Substance abuse  Patient Strengths: Ability for insight Average or above average intelligence Capable of independent living [employer] of knowledge Motivation for treatment/growth  Treatment Modalities: Medication Management, Group therapy, Case management,  1 to 1 session with clinician, Psychoeducation, Recreational therapy.   Physician Treatment Plan for Primary Diagnosis: Bipolar affective disorder, current episode mixed, without psychotic features (HCC) Long Term Goal(s): Improvement in symptoms so as ready for discharge Improvement in symptoms so as ready for  discharge   Short Term Goals: Ability to identify changes in lifestyle to reduce recurrence of condition will improve Ability to disclose and discuss suicidal ideas Ability to demonstrate self-control will improve Ability to identify and develop effective coping behaviors will improve Compliance with prescribed medications will improve Ability to identify triggers associated with substance abuse/mental health issues will improve  Medication Management: Evaluate patient's response, side effects, and tolerance of medication regimen.  Therapeutic Interventions: 1 to 1 sessions, Unit Group sessions and Medication administration.  Evaluation of Outcomes: Adequate for discharge   Physician Treatment Plan for Secondary Diagnosis: Principal Problem:   Bipolar affective disorder, current episode mixed, without psychotic features (HCC) Active Problems:   Suicidal ideation  Long Term Goal(s): Improvement in symptoms so as ready for discharge Improvement in symptoms so as ready for discharge   Short Term Goals: Ability to identify changes in lifestyle to reduce recurrence of condition will improve Ability to disclose and discuss suicidal ideas Ability to demonstrate self-control will improve Ability to identify and develop effective coping behaviors will improve Compliance with prescribed medications will improve Ability to identify triggers associated with substance abuse/mental health issues will improve     Medication Management: Evaluate patient's response, side effects, and tolerance of medication regimen.  Therapeutic Interventions: 1 to 1 sessions, Unit Group sessions and Medication administration.  Evaluation of Outcomes: Adequate for discharge   RN Treatment Plan for Primary Diagnosis: MDD, recurrent, severe Long Term Goal(s): Knowledge of disease and therapeutic regimen to maintain health will improve  Short Term Goals: Ability to remain free from injury will  improve, Ability to  verbalize frustration and anger appropriately will improve, Ability to disclose and discuss suicidal ideas and Ability to identify and develop effective coping behaviors will improve  Medication Management: RN will administer medications as ordered by provider, will assess and evaluate patient's response and provide education to patient for prescribed medication. RN will report any adverse and/or side effects to prescribing provider.  Therapeutic Interventions: 1 on 1 counseling sessions, Psychoeducation, Medication administration, Evaluate responses to treatment, Monitor vital signs and CBGs as ordered, Perform/monitor CIWA, COWS, AIMS and Fall Risk screenings as ordered, Perform wound care treatments as ordered.  Evaluation of Outcomes: Adequate for discharge   LCSW Treatment Plan for Primary Diagnosis: MDD, recurrent, severe Long Term Goal(s): Safe transition to appropriate next level of care at discharge, Engage patient in therapeutic group addressing interpersonal concerns.  Short Term Goals: Engage patient in aftercare planning with referrals and resources, Facilitate patient progression through stages of change regarding substance use diagnoses and concerns and Identify triggers associated with mental health/substance abuse issues  Therapeutic Interventions: Assess for all discharge needs, 1 to 1 time with Social worker, Explore available resources and support systems, Assess for adequacy in community support network, Educate family and significant other(s) on suicide prevention, Complete Psychosocial Assessment, Interpersonal group therapy.  Evaluation of Outcomes: Adequate for discharge.  Progress in Treatment: Attending groups: No. Participating in groups: Minimal investment in treatment  Taking medication as prescribed: Yes. Toleration medication: Yes. Family/Significant other contact made: SPE completed with pt as he did not allow for collateral contact with family.  Patient  understands diagnosis: Yes. Discussing patient identified problems/goals with staff: Yes. Medical problems stabilized or resolved: Yes. Denies suicidal/homicidal ideation: Yes.  Issues/concerns per patient self-inventory: No. Other: n/a   New problem(s) identified: No, Describe:  n/a  New Short Term/Long Term Goal(s): detox if needed, medication management for mood stabilization; elimination of SI thoughts; development of comprehensive mental wellness/sobriety plan.   Patient Goals:  "I want to get back on my medications."   Discharge Plan or Barriers: Pt has follow-up scheduled on Monday and has been provided with [facility] list/[facility] information. He declined any further resources.[facility] pamphlet, Mobile Crisis information, and AA/NA information provided to patient for additional community support and resources.   Reason for Continuation of Hospitalization: none  Estimated Length of Stay: Monday, 06/21/18  Attendees: Patient: 06/21/2018 8:43 AM  Physician: Dr. Altamese Carolinaainville MD; Dr. Jola Babinskilary MD 06/21/2018 8:43 AM  Nursing: Meriam SpragueBeverly RN; [facility] 06/21/2018 8:43 AM  RN Care Manager:X 06/21/2018 8:43 AM  Social Worker: Corrie MckusickHeather Zeinab Rodwell LCSW 06/21/2018 8:43 AM  Recreational Therapist: x 06/21/2018 8:43 AM  Other: Reola Calkinsravis Money NP; Hillery Jacksanika Lewis NP 06/21/2018 8:43 AM  Other:  06/21/2018 8:43 AM  Other: 06/21/2018 8:43 AM    Scribe for Treatment Team: Rona RavensHeather S Yamilee Harmes, LCSW 06/21/2018 8:43 AM

## 2018-06-21 NOTE — Progress Notes (Signed)
Patient did attend the evening speaker AA meeting.  

## 2018-06-21 NOTE — Progress Notes (Signed)
Recreation Therapy Notes  Date: 9.16.19 Time: 0930 Location: [address] Dayroom  Group Topic: Stress Management  Goal Area(s) Addresses:  Patient will verbalize importance of using healthy stress management.  Patient will identify positive emotions associated with healthy stress management.   Intervention: Stress Management  Activity : Guided Imagery.  LRT introduced the stress management technique of guided imagery.  LRT read a script for a forest meditation.  Patients were to follow along as the script was read to engage in the activity.  Education:  Stress Management, Discharge Planning.   Education Outcome: Acknowledges edcuation/In group clarification offered/Needs additional education  Clinical Observations/Feedback: Pt did not attend group.    Caroll RancherMarjette Ahlivia Salahuddin, LRT/CTRS         Caroll RancherLindsay, Euna Armon A 06/21/2018 12:14 PM

## 2018-06-21 NOTE — BHH Suicide Risk Assessment (Deleted)
[facility] Discharge Suicide Risk Assessment   Principal Problem: Bipolar affective disorder, current episode mixed, without psychotic features [facility]) Discharge Diagnoses:  Patient Active Problem List   Diagnosis Date Noted  . Bipolar affective disorder, current episode mixed, without psychotic features (HCC) [F31.60] 06/16/2018  . Suicidal ideation [R45.851]     Total Time spent with patient: 30 minutes  Musculoskeletal: Strength & Muscle Tone: within normal limits Gait & Station: normal Patient leans: N/A  Psychiatric Specialty Exam: Review of Systems  Constitutional: Negative for chills and fever.  Respiratory: Negative for cough and shortness of breath.   Cardiovascular: Negative for chest pain.  Gastrointestinal: Negative for abdominal pain, heartburn, nausea and vomiting.  Psychiatric/Behavioral: Negative for depression, hallucinations and suicidal ideas. The patient is not nervous/anxious and does not have insomnia.     Blood pressure 128/86, pulse (!) 101, temperature 98.4 F (36.9 C), temperature source Oral, resp. rate 16, height 6' (1.829 m), weight 75.8 kg.Body mass index is 22.65 kg/m.  General Appearance: Casual and Fairly Groomed  Patent attorney::  Good  Speech:  Clear and Coherent and Normal Rate  Volume:  Normal  Mood:  Euthymic and Irritable  Affect:  Appropriate and Congruent  Thought Process:  Coherent and Goal Directed  Orientation:  Full (Time, Place, and Person)  Thought Content:  Logical  Suicidal Thoughts:  No  Homicidal Thoughts:  No  Memory:  Immediate;   Fair Recent;   Fair Remote;   Fair  Judgement:  Fair  Insight:  Fair  Psychomotor Activity:  Normal  Concentration:  Good  Recall:  NA  Fund of Knowledge:Good  Language: Fair  Akathisia:  No  Handed:    AIMS (if indicated):     Assets:  Resilience Social Support  Sleep:  Number of Hours: 4  Cognition: WNL  ADL's:  Intact   Mental Status Per Nursing Assessment::   On Admission:  Suicidal  ideation indicated by patient, Self-harm thoughts, Self-harm behaviors  Demographic Factors:  Male, Low socioeconomic status and Unemployed  Loss Factors: Financial problems/change in socioeconomic status  Historical Factors: Family history of mental illness or substance abuse and Impulsivity  Risk Reduction Factors:   Positive social support, Positive therapeutic relationship and Positive coping skills or problem solving skills  Continued Clinical Symptoms:  Severe Anxiety and/or Agitation Bipolar Disorder:   Depressive phase  Cognitive Features That Contribute To Risk:  None    Suicide Risk:  Minimal: No identifiable suicidal ideation.  Patients presenting with no risk factors but with morbid ruminations; may be classified as minimal risk based on the severity of the depressive symptoms  Follow-up Information    [facility] Follow up on 06/25/2018.   Specialty:  Behavioral Health Why:  Hospital follow-up on Friday, 9/20 at 9:45AM with Merlyn Albert. Please bring current medications/hospital discharge paperwork to this appt. Thank you.  Contact information: [address] [place] [address] [phone]         Subjective Data:  Austin Doyle is a 37 y/o M with history of Bipolar disorder and cocaine use disorder who was admitted voluntarily from [facility] where he presented with worsening depression, SI with plan to walk into traffic, AH, VH, recent use of cocaine, and poor outpatient medication adherence. Pt was medically cleared and then transferred to [facility] for additional treatment and stabilization. Pt was restarted on previous medications of depakote and seroquel. He reported gradual improvement of his presenting symptoms.  Today upon evaluation, pt shares, "I'm good. Just trying to figure  out my housing situation." Pt shares that he has ongoing anxiety about homelessness and uncertainty of where he will be staying. He otherwise denies specific concerns today. He is sleeping  well. His appetite is good. He denies SI/HI/AH/VH. He is tolerating his medications well, and he is in agreement to continue his current regimen without changes. He plans to [facility]. [facility] and working towards transitioning out of homelessness over the long-term. He was able to engage in safety planning including plan [facility] or contact emergency services if he feels unable to maintain his own safety or the safety of others. Pt had no further questions, comments, or concerns.   Plan Of Care/Follow-up recommendations:   -Discharge to outpatient level of care  -Bipolar I, current episode depressed             -Continue depakote DR 500mg  po bid             -Continue seroquel 200mg  po qhs  -Anxiety             -Continue vistaril 50mg  po q6h prn anxiety  -Migraines               -Continue imitrex 50mg  po q2h prn migraine  -Insomnia              -Continue trazodone 50mg  po qhs prn insomnia  Activity:  as tolerated Diet:  normal Tests:  NA Other:  see above for DC plan  Austin Likenshristopher T Yordin Rhoda, MD 06/21/2018, 10:15 AM

## 2018-06-21 NOTE — Progress Notes (Signed)
Pt's discharge has been rescheduled for Tuesday, 9/17. Pt plans to [facility] and will [facility].  Tishina Lown S. Alan RipperHolloway, MSW, LCSW Clinical Social Worker 06/21/2018 2:42 PM

## 2018-06-21 NOTE — BHH Group Notes (Signed)
LCSW Group Therapy Note   06/21/2018 1:15pm   Type of Therapy and Topic:  Group Therapy:  Overcoming Obstacles   Participation Level:  Active   Description of Group:    In this group patients will be encouraged to explore what they see as obstacles to their own wellness and recovery. They will be guided to discuss their thoughts, feelings, and behaviors related to these obstacles. The group will process together ways to cope with barriers, with attention given to specific choices patients can make. Each patient will be challenged to identify changes they are motivated to make in order to overcome their obstacles. This group will be process-oriented, with patients participating in exploration of their own experiences as well as giving and receiving support and challenge from other group members.   Therapeutic Goals: 1. Patient will identify personal and current obstacles as they relate to admission. 2. Patient will identify barriers that currently interfere with their wellness or overcoming obstacles.  3. Patient will identify feelings, thought process and behaviors related to these barriers. 4. Patient will identify two changes they are willing to make to overcome these obstacles:      Summary of Patient Progress   Austin Doyle was attentive and engaged during today's processing group. He shared that his biggest obstacle involves getting stable housing. He plans to [facility]. Austin Doyle feels comfortable with this plan and is hoping to maintain stable and sober living while saving money. He continues to show progress in the group setting with improving insight.    Therapeutic Modalities:   Cognitive Behavioral Therapy Solution Focused Therapy Motivational Interviewing Relapse Prevention Therapy  Rona RavensHeather S Troyce Febo, LCSW 06/21/2018 2:47 PM

## 2018-06-21 NOTE — Discharge Summary (Addendum)
Physician Discharge Summary Note  Patient:  Austin Doyle is an 37 y.o., male MRN:  [id number] DOB:  1981-07-24 Patient phone:  [phone] (home)  Patient address:   [place] [address],  Total Time spent with patient: 15 minutes  Date of Admission:  06/15/2018 Date of Discharge: 06/22/2018  Reason for Admission: Per assessment note:This is an admission assessment for this 37 year old AA male with hx of mental illness. Admitted to the [facility] from the [facility] hospital with complaints of suicidal ideations with plan to get hit by a car. Chart review indicated that patient was brought to the [facility] ED by the [employer] after patient was spotted dodging cars. He was trying to get hit by a car so to die. Apparently, patient reported at the ED that all these were triggered by Austin Doyle headaches. He has been homeless x 2 years.   Principal Problem: Bipolar affective disorder, current episode mixed, without psychotic features [facility]) Discharge Diagnoses: Patient Active Problem List   Diagnosis Date Noted  . Bipolar affective disorder, current episode mixed, without psychotic features (HCC) [F31.60] 06/16/2018  . Suicidal ideation [R45.851]     Past Psychiatric History:   Past Medical History:  Past Medical History:  Diagnosis Date  . Anxiety   . Depression    History reviewed. No pertinent surgical history. Family History: History reviewed. No pertinent family history. Family Psychiatric  History:  Social History:  Social History   Substance and Sexual Activity  Alcohol Use Yes  . Frequency: Never     Social History   Substance and Sexual Activity  Drug Use Yes  . Types: Cocaine    Social History   Socioeconomic History  . Marital status: Single    Spouse name: Not on file  . Number of children: Not on file  . Years of education: Not on file  . Highest education level: Not on file  Occupational History  . Not on file  Social Needs  . Financial resource strain:  Not on file  . Food insecurity:    Worry: Not on file    Inability: Not on file  . Transportation needs:    Medical: Not on file    Non-medical: Not on file  Tobacco Use  . Smoking status: Current Every Day Smoker    Types: Cigarettes  . Smokeless tobacco: Never Used  Substance and Sexual Activity  . Alcohol use: Yes    Frequency: Never  . Drug use: Yes    Types: Cocaine  . Sexual activity: Yes  Lifestyle  . Physical activity:    Days per week: Not on file    Minutes per session: Not on file  . Stress: Not on file  Relationships  . Social connections:    Talks on phone: Not on file    Gets together: Not on file    Attends religious service: Not on file    Active member of club or organization: Not on file    Attends meetings of clubs or organizations: Not on file    Relationship status: Not on file  Other Topics Concern  . Not on file  Social History Narrative  . Not on file    Hospital Course:  Austin Doyle was admitted for Bipolar affective disorder, current episode mixed, without psychotic features (HCC)  and crisis management.  Pt was treated discharged with the medications listed below under Medication List.  Medical problems were identified and treated as needed.  Home medications were restarted  as appropriate.  Improvement was monitored by observation and Austin Doyle 's daily report of symptom reduction.  Emotional and mental status was monitored by daily self-inventory reports completed by Austin Doyle and clinical staff.         Austin Doyle was evaluated by the treatment team for stability and plans for continued recovery upon discharge. Austin Doyle 's motivation was an integral factor for scheduling further treatment. Employment, transportation, bed availability, health status, family support, and any pending legal issues were also considered during hospital stay. Pt was offered further treatment options upon discharge including but not limited to  Residential, Intensive Outpatient, and Outpatient treatment.  Austin Doyle will follow up with the services as listed below under Follow Up Information.     Upon completion of this admission the patient was both mentally and medically stable for discharge denying suicidal/homicidal ideation, auditory/visual/tactile hallucinations, delusional thoughts and paranoia.   Austin Doyle responded well to treatment with Depakote 500 mg BID and Neurontin 300 mg TID and Seroquel 200 mg QHS without adverse effects.  Pt demonstrated improvement without reported or observed adverse effects to the point of stability appropriate for outpatient management. Pertinent labs include:CMP and CBC, for which outpatient follow-up is necessary for lab recheck as mentioned below. Reviewed CBC, CMP, BAL, and UDS+ Cocaine ; all unremarkable aside from noted exceptions.   Physical Findings: AIMS: Facial and Oral Movements Muscles of Facial Expression: None, normal Lips and Perioral Area: None, normal Jaw: None, normal Tongue: None, normal,Extremity Movements Upper (arms, wrists, hands, fingers): None, normal Lower (legs, knees, ankles, toes): None, normal, Trunk Movements Neck, shoulders, hips: None, normal, Overall Severity Severity of abnormal movements (highest score from questions above): None, normal Incapacitation due to abnormal movements: None, normal Patient's awareness of abnormal movements (rate only patient's report): No Awareness, Dental Status Current problems with teeth and/or dentures?: No Does patient usually wear dentures?: No  CIWA:  CIWA-Ar Total: 1 COWS:  COWS Total Score: 1  Musculoskeletal: Strength & Muscle Tone: within normal limits Gait & Station: normal Patient leans: N/A  Psychiatric Specialty Exam: See SRA by MD Physical Exam  Vitals reviewed. Constitutional: He appears well-developed.  Cardiovascular: Normal rate.  Neurological: He is alert.  Psychiatric: He has a normal mood and  affect. His behavior is normal.    Review of Systems  Psychiatric/Behavioral: Negative for depression and suicidal ideas. Substance abuse: stable. The patient is not nervous/anxious.   All other systems reviewed and are negative.   Blood pressure 125/88, pulse (!) 106, temperature 98.1 F (36.7 C), temperature source Oral, resp. rate 20, height 6' (1.829 m), weight 75.8 kg, SpO2 96 %.Body mass index is 22.65 kg/m.    Have you used any form of tobacco in the last 30 days? (Cigarettes, Smokeless Tobacco, Cigars, and/or Pipes): Yes  Has this patient used any form of tobacco in the last 30 days? (Cigarettes, Smokeless Tobacco, Cigars, and/or Pipes) Yes, Yes, A prescription for an FDA-approved tobacco cessation medication was offered at discharge and the patient refused  Blood Alcohol level:  Lab Results  Component Value Date   ETH <10 06/14/2018    Metabolic Disorder Labs:  No results found for: HGBA1C, MPG No results found for: PROLACTIN No results found for: CHOL, TRIG, HDL, CHOLHDL, VLDL, LDLCALC  See Psychiatric Specialty Exam and Suicide Risk Assessment completed by Attending Physician prior to discharge.  Discharge destination:  Home  Is patient on multiple antipsychotic therapies at discharge:  No   Has Patient had  three or more failed trials of antipsychotic monotherapy by history:  No  Recommended Plan for Multiple Antipsychotic Therapies: NA  Discharge Instructions    Diet - low sodium heart healthy   Complete by:  As directed    Discharge instructions   Complete by:  As directed    Take all medications as prescribed. Keep all follow-up appointments as scheduled.  Do not consume alcohol or use illegal drugs while on prescription medications. Report any adverse effects from your medications to your primary care provider promptly.  In the event of recurrent symptoms or worsening symptoms, call 911, a crisis hotline, or go to the nearest emergency department for  evaluation.   Increase activity slowly   Complete by:  As directed      Allergies as of 06/22/2018   No Known Allergies     Medication List    TAKE these medications     Indication  divalproex 500 MG DR tablet Commonly known as:  DEPAKOTE Take 1 tablet (500 mg total) by mouth every 12 (twelve) hours.  Indication:  Mood stabilization   gabapentin 300 MG capsule Commonly known as:  NEURONTIN Take 1 capsule (300 mg total) by mouth 3 (three) times daily.  Indication:  Disease of the Peripheral Nerves   QUEtiapine 200 MG tablet Commonly known as:  SEROQUEL Take 1 tablet (200 mg total) by mouth at bedtime.  Indication:  Major Depressive Disorder, Mood control   ramelteon 8 MG tablet Commonly known as:  ROZEREM Take 1 tablet (8 mg total) by mouth at bedtime.  Indication:  Trouble Sleeping      Follow-up Information    [facility] Follow up.   Why:  Walk in within 3 days of hospital discharge to be assessed for outpatient mental health services. Walk in hours: Mon-Fri at 8:30AM. Thank you.  Contact information: [address] [place], [place] [address] Phone: [phone] Fax: [phone]          Follow-up recommendations:  Activity:  as tolerated Diet:  heart healthy  Comments:  Take all medications as prescribed. Keep all follow-up appointments as scheduled.  Do not consume alcohol or use illegal drugs while on prescription medications. Report any adverse effects from your medications to your primary care provider promptly.  In the event of recurrent symptoms or worsening symptoms, call 911, a crisis hotline, or go to the nearest emergency department for evaluation.   Signed: Micheal Likens, MD   Patient seen, Suicide Assessment Completed.  Disposition Plan Reviewed

## 2018-06-21 NOTE — Progress Notes (Signed)
D: Pt was in dayroom upon initial approach.  Pt presents with depressed affect and mood.  Pt reports his day was "shit but it's a lot better now that I got some information."  Pt reports [facility] is [facility] "around 11" and he feels safe to do so.  Pt denies SI/HI, denies hallucinations, denies pain.  Pt has been visible in milieu interacting with peers and staff appropriately.  Pt is seen laughing and joking with peers at times.   A: Introduced self to pt.  Actively listened to pt and offered support and encouragement. Medications administered per order.  PRN medication administered for anxiety.  Q15 minute safety checks maintained.  R: Pt is safe on the unit.  Pt is compliant with medications.  Pt verbally contracts for safety.  Will continue to monitor and assess.

## 2018-06-21 NOTE — Progress Notes (Signed)
[facility] MD Progress Note  06/21/2018 4:03 PM Austin Doyle  MRN:  [id number] Subjective:    Austin Doyle is a 37 y/o M with history of Bipolar disorder and cocaine use disorder who [facility] where he presented with worsening depression, SI with plan to walk into traffic, AH, VH, recent use of cocaine, and poor outpatient medication adherence. Pt was medically cleared and then transferred to [facility] for additional treatment and stabilization. Pt was restarted on previous medications of depakote and seroquel. He reported gradual improvement of his presenting symptoms.  Today upon evaluation, pt shares, "I'm good. Just trying to figure out my housing situation." Pt shares that he has ongoing anxiety about homelessness and uncertainty of where he will be staying. He otherwise denies specific concerns today. He is sleeping well. His appetite is good. He denies SI/HI/AH/VH. He is tolerating his medications well, and he is in agreement to continue his current regimen without changes. He has concerns about his housing situation after leaving hospital, and he feels that returning to homelessness without a plan would cause him to rapidly decompensate after discharge. We discussed alternative options, and pt met with SW team about option of going to [employer]. Pt was open to that option and he would be ready to go as soon as tomorrow AM. We will plan on tentatively pt discharging in the AM after we meet with him again tomorrow and confirm he still agrees to that plan. Pt had no further questions, comments, or concerns.  Principal Problem: Bipolar affective disorder, current episode mixed, without psychotic features ([place]) Diagnosis:   Patient Active Problem List   Diagnosis Date Noted  . Bipolar affective disorder, current episode mixed, without psychotic features ([place]) [F31.60] 06/16/2018  . Suicidal ideation [R45.851]    Total Time spent with patient: 30 minutes  Past  Psychiatric History: see H&P  Past Medical History:  Past Medical History:  Diagnosis Date  . Anxiety   . Depression    History reviewed. No pertinent surgical history. Family History: History reviewed. No pertinent family history. Family Psychiatric  History: see H&P Social History:  Social History   Substance and Sexual Activity  Alcohol Use Yes  . Frequency: Never     Social History   Substance and Sexual Activity  Drug Use Yes  . Types: Cocaine    Social History   Socioeconomic History  . Marital status: Single    Spouse name: Not on file  . Number of children: Not on file  . Years of education: Not on file  . Highest education level: Not on file  Occupational History  . Not on file  Social Needs  . Financial resource strain: Not on file  . Food insecurity:    Worry: Not on file    Inability: Not on file  . Transportation needs:    Medical: Not on file    Non-medical: Not on file  Tobacco Use  . Smoking status: Current Every Day Smoker    Types: Cigarettes  . Smokeless tobacco: Never Used  Substance and Sexual Activity  . Alcohol use: Yes    Frequency: Never  . Drug use: Yes    Types: Cocaine  . Sexual activity: Yes  Lifestyle  . Physical activity:    Days per week: Not on file    Minutes per session: Not on file  . Stress: Not on file  Relationships  . Social connections:    Talks on phone: Not on  file    Gets together: Not on file    Attends religious service: Not on file    Active member of club or organization: Not on file    Attends meetings of clubs or organizations: Not on file    Relationship status: Not on file  Other Topics Concern  . Not on file  Social History Narrative  . Not on file   Additional Social History:                         Sleep: Good  Appetite:  Good  Current Medications: Current Facility-Administered Medications  Medication Dose Route Frequency Provider Last Rate Last Dose  . divalproex (DEPAKOTE)  DR tablet 500 mg  500 mg Oral Q12H Patrecia Pour, NP   500 mg at 06/21/18 5374  . gabapentin (NEURONTIN) capsule 300 mg  300 mg Oral TID Patrecia Pour, NP   300 mg at 06/21/18 1214  . hydrOXYzine (ATARAX/VISTARIL) tablet 50 mg  50 mg Oral Q6H PRN Pennelope Bracken, MD   50 mg at 06/21/18 0814  . ibuprofen (ADVIL,MOTRIN) tablet 800 mg  800 mg Oral Q6H PRN Patrecia Pour, NP   800 mg at 06/21/18 0815  . QUEtiapine (SEROQUEL) tablet 200 mg  200 mg Oral QHS Nwoko, Agnes I, NP   200 mg at 06/20/18 2215  . ramelteon (ROZEREM) tablet 8 mg  8 mg Oral QHS Patrecia Pour, NP   8 mg at 06/20/18 2215  . SUMAtriptan (IMITREX) tablet 50 mg  50 mg Oral Q2H PRN Lindell Spar I, NP   50 mg at 06/18/18 1948    Lab Results: No results found for this or any previous visit (from the past 39 hour(s)).  Blood Alcohol level:  Lab Results  Component Value Date   ETH <10 82/70/7867    Metabolic Disorder Labs: No results found for: HGBA1C, MPG No results found for: PROLACTIN No results found for: CHOL, TRIG, HDL, CHOLHDL, VLDL, LDLCALC  Physical Findings: AIMS: Facial and Oral Movements Muscles of Facial Expression: None, normal Lips and Perioral Area: None, normal Jaw: None, normal Tongue: None, normal,Extremity Movements Upper (arms, wrists, hands, fingers): None, normal Lower (legs, knees, ankles, toes): None, normal, Trunk Movements Neck, shoulders, hips: None, normal, Overall Severity Severity of abnormal movements (highest score from questions above): None, normal Incapacitation due to abnormal movements: None, normal Patient's awareness of abnormal movements (rate only patient's report): No Awareness, Dental Status Current problems with teeth and/or dentures?: No Does patient usually wear dentures?: No  CIWA:  CIWA-Ar Total: 1 COWS:  COWS Total Score: 1  Musculoskeletal: Strength & Muscle Tone: within normal limits Gait & Station: normal Patient leans: N/A  Psychiatric Specialty  Exam: Physical Exam  Nursing note and vitals reviewed.   Review of Systems  Constitutional: Negative for chills and fever.  Respiratory: Negative for cough and shortness of breath.   Cardiovascular: Negative for chest pain.  Gastrointestinal: Negative for abdominal pain, heartburn, nausea and vomiting.  Psychiatric/Behavioral: Positive for depression. Negative for hallucinations and suicidal ideas. The patient is nervous/anxious. The patient does not have insomnia.     Blood pressure 128/86, pulse (!) 101, temperature 98.4 F (36.9 C), temperature source Oral, resp. rate 16, height 6' (1.829 m), weight 75.8 kg.Body mass index is 22.65 kg/m.  General Appearance: Casual and Fairly Groomed  Eye Contact:  Good  Speech:  Clear and Coherent and Normal Rate  Volume:  Normal  Mood:  Anxious and Depressed  Affect:  Appropriate and Congruent  Thought Process:  Coherent and Goal Directed  Orientation:  Full (Time, Place, and Person)  Thought Content:  Logical  Suicidal Thoughts:  No  Homicidal Thoughts:  No  Memory:  Immediate;   Fair Recent;   Fair Remote;   Fair  Judgement:  Fair  Insight:  Fair  Psychomotor Activity:  Normal  Concentration:  Concentration: Fair  Recall:  [employer] of Knowledge:  Fair  Language:  Fair  Akathisia:  No  Handed:    AIMS (if indicated):     Assets:  Resilience Social Support  ADL's:  Intact  Cognition:  WNL  Sleep:  Number of Hours: 4   Treatment Plan Summary: Daily contact with patient to assess and evaluate symptoms and progress in treatment and Medication management   -Continue inpatient hospitalization  -Bipolar I, current episode depressed -Continue depakote DR 590m po bid -Continue seroquel 2044mpo qhs  -Anxiety -Continue vistaril 5030mo q6h prn anxiety  -Migraines  -Continue imitrex 22m52m q2h prn migraine  -Insomnia  -Continue trazodone 22mg58mqhs prn  insomnia  Activity:  as tolerated Diet:  normal Tests:  NA Other:  see above for DC plan  ChrisPennelope Bracken9/16/2019, 4:03 PM

## 2018-06-21 NOTE — Plan of Care (Addendum)
Nurse discussed anxiety, depression, coping skills with patient. 

## 2018-06-21 NOTE — Progress Notes (Signed)
  [facility] :  Will you be returning to the same living situation after discharge:  Yes,  friend's home or shelter. Pt declined shelter information At discharge, do you have transportation home?: Yes,  bus Do you have the ability to pay for your medications: Yes,  mental health  Release of information consent forms completed and submitted to medical records by CSW.  Patient to Follow up at: Follow-up Information    [facility] Follow up on 06/25/2018.   Specialty:  Behavioral Health Why:  Hospital follow-up on Friday, 9/20 at 9:45AM with Merlyn AlbertFred. Please bring current medications/hospital discharge paperwork to this appt. Thank you.  Contact information: [address] [place] [address] [phone]           Next level of care provider has access to [facility] Link:no  Safety Planning and Suicide Prevention discussed: Yes,  SPE completed with pt;pt declined to consent to collateral contact. SPI pamphlet and mobile Crisis information provided.  Have you used any form of tobacco in the last 30 days? (Cigarettes, Smokeless Tobacco, Cigars, and/or Pipes): Yes  Has patient been referred to the Quitline?: Patient refused referral  Patient has been referred for addiction treatment: Yes  Rona RavensHeather S Hajra Port, LCSW 06/21/2018, 9:18 AM

## 2018-06-22 NOTE — Progress Notes (Addendum)
Nursing discharge note: Patient discharged home per MD order.  Patient received all personal belongings from unit and locker.  Reviewed AVS/transition record with patient and he indicated understanding.  [facility]  He received medication samples and prescriptions. Patient denies any thoughts of self harm.  He left ambulatory with a bus pass.

## 2018-06-22 NOTE — Progress Notes (Signed)
  [facility] :  Will you be returning to the same living situation after discharge:  No. [facility] plans to [facility]  At discharge, do you have transportation home?: Yes, [place] bus pass and [place] bus passes provided  Do you have the ability to pay for your medications: Yes,  mental health  Release of information consent forms completed and submitted to medical records by CSW.   Patient to Follow up at: Follow-up Information    [facility] Follow up.   Why:  Walk in within 3 days of hospital discharge to be assessed for outpatient mental health services. Walk in hours: Mon-Fri at 8:30AM. Thank you.  Contact information: [address]  , [place] [address] Phone: [phone] Fax: [phone]          Next level of care provider has access to [facility] Link:no  Safety Planning and Suicide Prevention discussed: Yes,  SPE completed with pt; pt declined to consent to collateral contact. SPI pamphlet and Mobile Crisis information provided.   Have you used any form of tobacco in the last 30 days? (Cigarettes, Smokeless Tobacco, Cigars, and/or Pipes): Yes  Has patient been referred to the Quitline?: Patient refused referral  Patient has been referred for addiction treatment: Yes  Rona RavensHeather S Jazzie Trampe, LCSW 06/22/2018, 10:48 AM

## 2018-06-22 NOTE — BHH Suicide Risk Assessment (Signed)
[facility] Discharge Suicide Risk Assessment   Principal Problem: Bipolar affective disorder, current episode mixed, without psychotic features [facility]) Discharge Diagnoses:  Patient Active Problem List   Diagnosis Date Noted  . Bipolar affective disorder, current episode mixed, without psychotic features ([facility]) [F31.60] 06/16/2018  . Suicidal ideation [R45.851]     Total Time spent with patient: 30 minutes  Musculoskeletal: Strength & Muscle Tone: within normal limits Gait & Station: normal Patient leans: N/A  Psychiatric Specialty Exam: Review of Systems  Constitutional: Negative for chills and fever.  Respiratory: Negative for cough and shortness of breath.   Cardiovascular: Negative for chest pain.  Gastrointestinal: Negative for abdominal pain, heartburn, nausea and vomiting.  Psychiatric/Behavioral: Negative for depression, hallucinations and suicidal ideas. The patient is not nervous/anxious and does not have insomnia.     Blood pressure 125/88, pulse (!) 106, temperature 98.1 F (36.7 C), temperature source Oral, resp. rate 20, height 6' (1.829 m), weight 75.8 kg, SpO2 96 %.Body mass index is 22.65 kg/m.  General Appearance: Casual and Fairly Groomed  Patent attorney::  Good  Speech:  Clear and Coherent and Normal Rate  Volume:  Normal  Mood:  Euthymic  Affect:  Appropriate and Congruent  Thought Process:  Coherent and Goal Directed  Orientation:  Full (Time, Place, and Person)  Thought Content:  Logical  Suicidal Thoughts:  No  Homicidal Thoughts:  No  Memory:  Immediate;   Fair Recent;   Fair Remote;   Fair  Judgement:  Fair  Insight:  Fair  Psychomotor Activity:  Normal  Concentration:  Good  Recall:  Good  Fund of Knowledge:Fair  Language: Fair  Akathisia:  No  Handed:    AIMS (if indicated):     Assets:  Resilience Social Support  Sleep:  Number of Hours: 6.25  Cognition: WNL  ADL's:  Intact   Mental Status Per Nursing Assessment::   On Admission:  Suicidal  ideation indicated by patient, Self-harm thoughts, Self-harm behaviors  Demographic Factors:  Male, Low socioeconomic status, Living alone and Unemployed  Loss Factors: Financial problems/change in socioeconomic status  Historical Factors: Impulsivity  Risk Reduction Factors:   Positive social support and Positive coping skills or problem solving skills  Continued Clinical Symptoms:  Bipolar Disorder:   Mixed State  Cognitive Features That Contribute To Risk:  None    Suicide Risk:  Minimal: No identifiable suicidal ideation.  Patients presenting with no risk factors but with morbid ruminations; may be classified as minimal risk based on the severity of the depressive symptoms  Follow-up Information    [facility] Follow up.   Why:  Walk in within 3 days of hospital discharge to be assessed for outpatient mental health services. Walk in hours: Mon-Fri at 8:30AM. Thank you.  Contact information: [address] [place], [place] [address] Phone: [phone] Fax: [phone]        Subjective Data:  Austin Doyle is a 37 y/o M with history of Bipolar disorder and cocaine use disorder who [facility] where he presented with worsening depression, SI with plan to walk into traffic, AH, VH, recent use of cocaine, and poor outpatient medication adherence. Pt was medically cleared and then transferred to [facility] for additional treatment and stabilization.Pt was restarted on previous medications of depakote and seroquel. He reported gradual improvement of his presenting symptoms.  Today upon evaluation, pt shares, "I'm fine." He is looking forward to staying at [facility] after discharge and maintaining his sobriety  while working towards stable housing. He denies specific concerns today. He is sleeping well. His appetite is good. He denies SI/HI/AH/VH. He is tolerating his medications well, and he is in agreement to continue his current regimen  without changes. He was able to engage in safety planning including plan to [facility] or contact emergency services if he feels unable to maintain his own safety or the safety of others. Pt had no further questions, comments, or concerns.     Plan Of Care/Follow-up recommendations:   -Discharge to outpatient level of care  -Bipolar I, current episode depressed -Continuedepakote DR 500mg  pobid -Continueseroquel 200mg  po qhs  -Anxiety -Continue vistaril 50mg  po q6h prn anxiety  -Migraines -Continueimitrex 50mg  po q2h prn migraine  -Insomnia  -Continuetrazodone 50mg  po qhs prn insomnia  Activity:  as tolerated Diet:  normal Tests:  NA Other:  see above for DC plan  Micheal Likenshristopher T Shanna Strength, MD 06/22/2018, 10:00 AM

## 2018-06-22 NOTE — Plan of Care (Signed)
  Problem: Education: Goal: Knowledge of [facility] Education information/materials will improve Outcome: Completed/Met Goal: Emotional status will improve Outcome: Completed/Met Goal: Mental status will improve Outcome: Completed/Met Goal: Verbalization of understanding the information provided will improve Outcome: Completed/Met   Problem: Activity: Goal: Interest or engagement in activities will improve Outcome: Completed/Met Goal: Sleeping patterns will improve Outcome: Completed/Met   Problem: Coping: Goal: Ability to verbalize frustrations and anger appropriately will improve Outcome: Completed/Met Goal: Ability to demonstrate self-control will improve Outcome: Completed/Met   Problem: Health Behavior/Discharge Planning: Goal: Identification of resources available to assist in meeting health care needs will improve Outcome: Completed/Met Goal: Compliance with treatment plan for underlying cause of condition will improve Outcome: Completed/Met   Problem: Physical Regulation: Goal: Ability to maintain clinical measurements within normal limits will improve Outcome: Completed/Met   Problem: Safety: Goal: Periods of time without injury will increase Outcome: Completed/Met   Problem: Education: Goal: Ability to make informed decisions regarding treatment will improve Outcome: Completed/Met   Problem: Coping: Goal: Coping ability will improve Outcome: Completed/Met   Problem: Health Behavior/Discharge Planning: Goal: Identification of resources available to assist in meeting health care needs will improve Outcome: Completed/Met   Problem: Medication: Goal: Compliance with prescribed medication regimen will improve Outcome: Completed/Met   Problem: Self-Concept: Goal: Ability to disclose and discuss suicidal ideas will improve Outcome: Completed/Met Goal: Will verbalize positive feelings about self Outcome: Completed/Met   Problem: Education: Goal:  Utilization of techniques to improve thought processes will improve Outcome: Completed/Met Goal: Knowledge of the prescribed therapeutic regimen will improve Outcome: Completed/Met   Problem: Activity: Goal: Interest or engagement in leisure activities will improve Outcome: Completed/Met Goal: Imbalance in normal sleep/wake cycle will improve Outcome: Completed/Met   Problem: Coping: Goal: Coping ability will improve Outcome: Completed/Met Goal: Will verbalize feelings Outcome: Completed/Met   Problem: Health Behavior/Discharge Planning: Goal: Ability to make decisions will improve Outcome: Completed/Met Goal: Compliance with therapeutic regimen will improve Outcome: Completed/Met   Problem: Role Relationship: Goal: Will demonstrate positive changes in social behaviors and relationships Outcome: Completed/Met   Problem: Safety: Goal: Ability to disclose and discuss suicidal ideas will improve Outcome: Completed/Met Goal: Ability to identify and utilize support systems that promote safety will improve Outcome: Completed/Met   Problem: Self-Concept: Goal: Will verbalize positive feelings about self Outcome: Completed/Met Goal: Level of anxiety will decrease Outcome: Completed/Met   Problem: Education: Goal: Knowledge of disease or condition will improve Outcome: Completed/Met Goal: Understanding of discharge needs will improve Outcome: Completed/Met   Problem: Health Behavior/Discharge Planning: Goal: Ability to identify changes in lifestyle to reduce recurrence of condition will improve Outcome: Completed/Met Goal: Identification of resources available to assist in meeting health care needs will improve Outcome: Completed/Met   Problem: Physical Regulation: Goal: Complications related to the disease process, condition or treatment will be avoided or minimized Outcome: Completed/Met   Problem: Safety: Goal: Ability to remain free from injury will  improve Outcome: Completed/Met   Problem: Education: Goal: Knowledge of General Education information will improve Description Including pain rating scale, medication(s)/side effects and non-pharmacologic comfort measures Outcome: Completed/Met   Problem: Health Behavior/Discharge Planning: Goal: Ability to manage health-related needs will improve Outcome: Completed/Met   Problem: Coping: Goal: Level of anxiety will decrease Outcome: Completed/Met   Problem: Safety: Goal: Ability to remain free from injury will improve Outcome: Completed/Met

## 2019-01-09 IMAGING — CR DG CHEST 2V
2 series · 2 of 2 positions shown · non-contrast
Comparison: None.

CLINICAL DATA: Chest pain

EXAM:
CHEST - 2 VIEW

[chest pa]
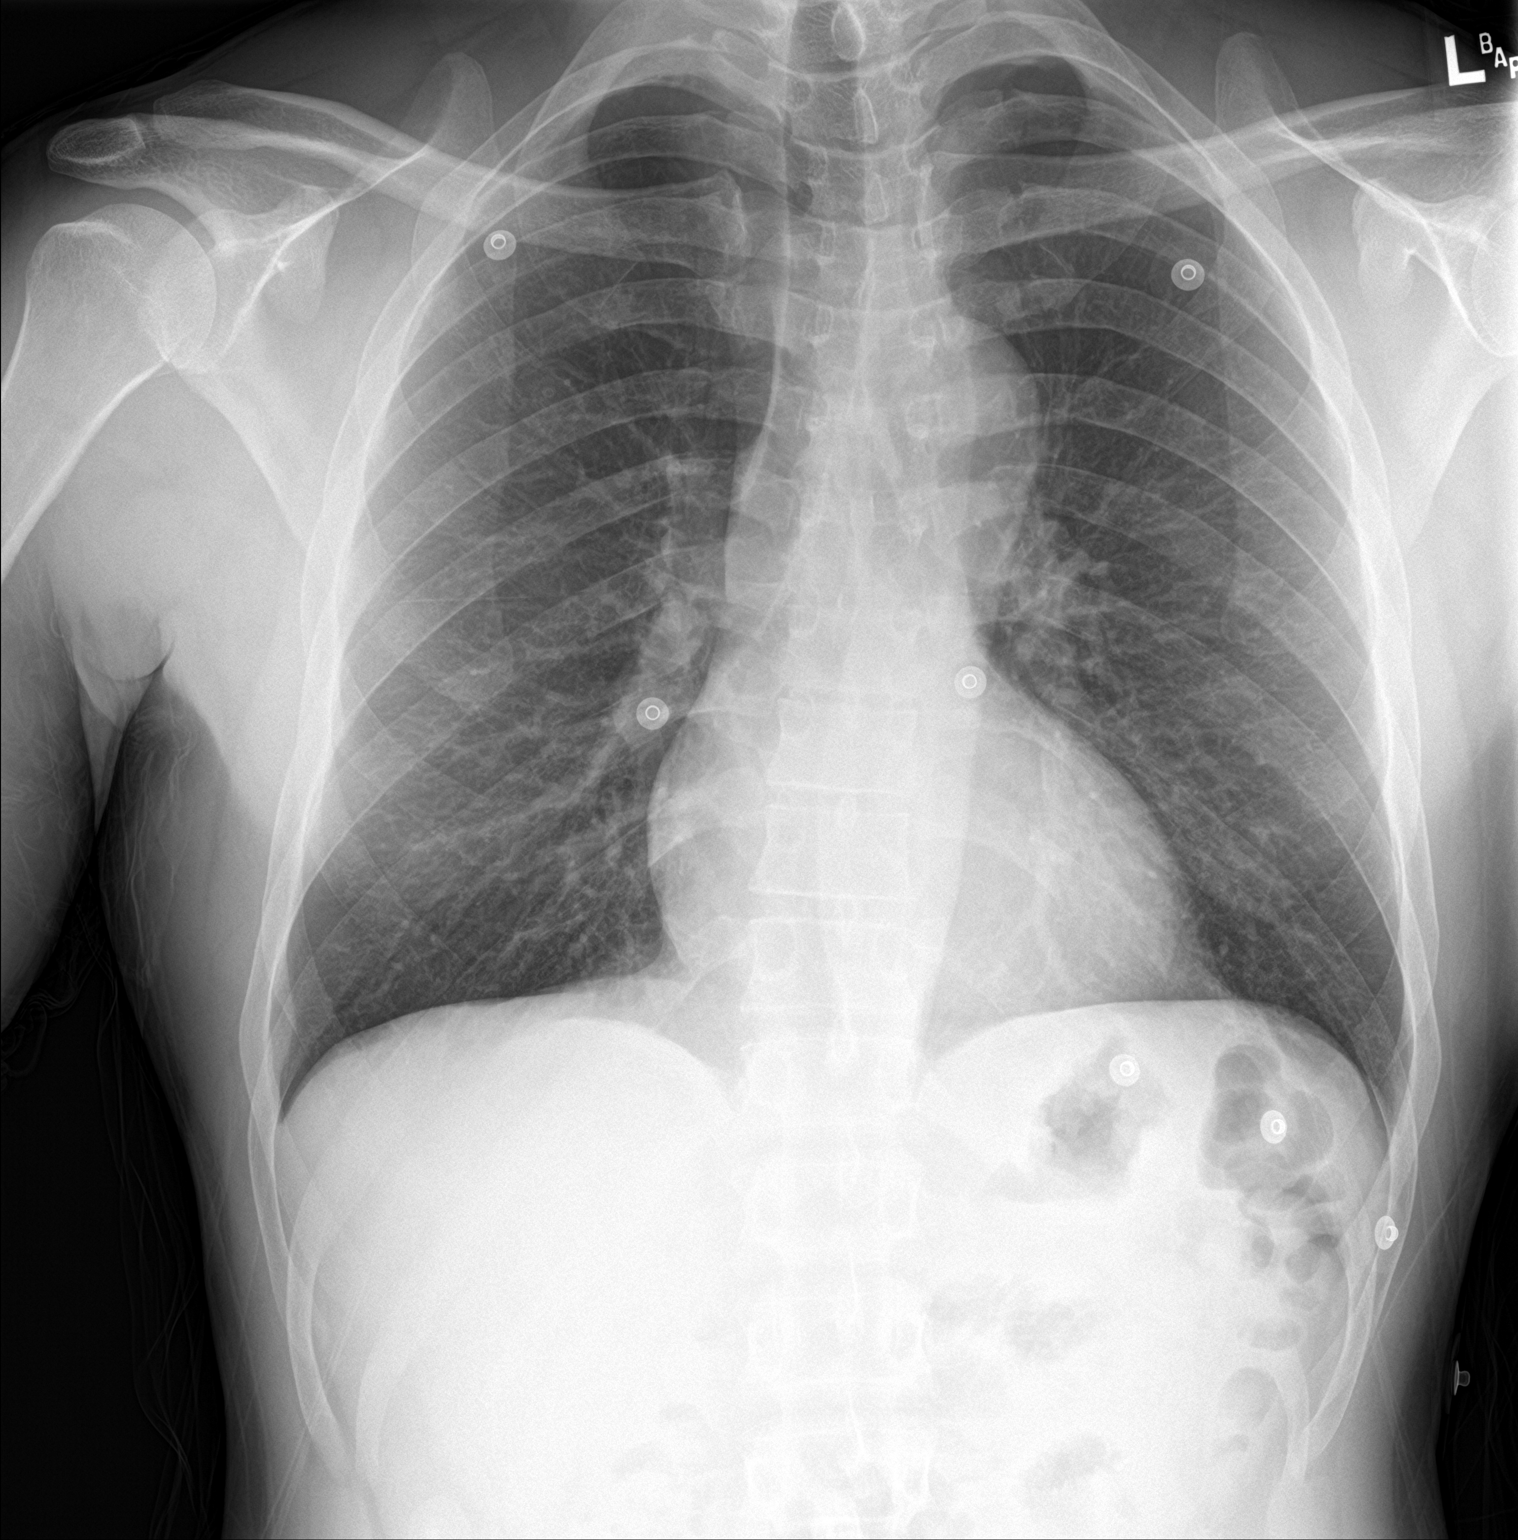

[chest lat]
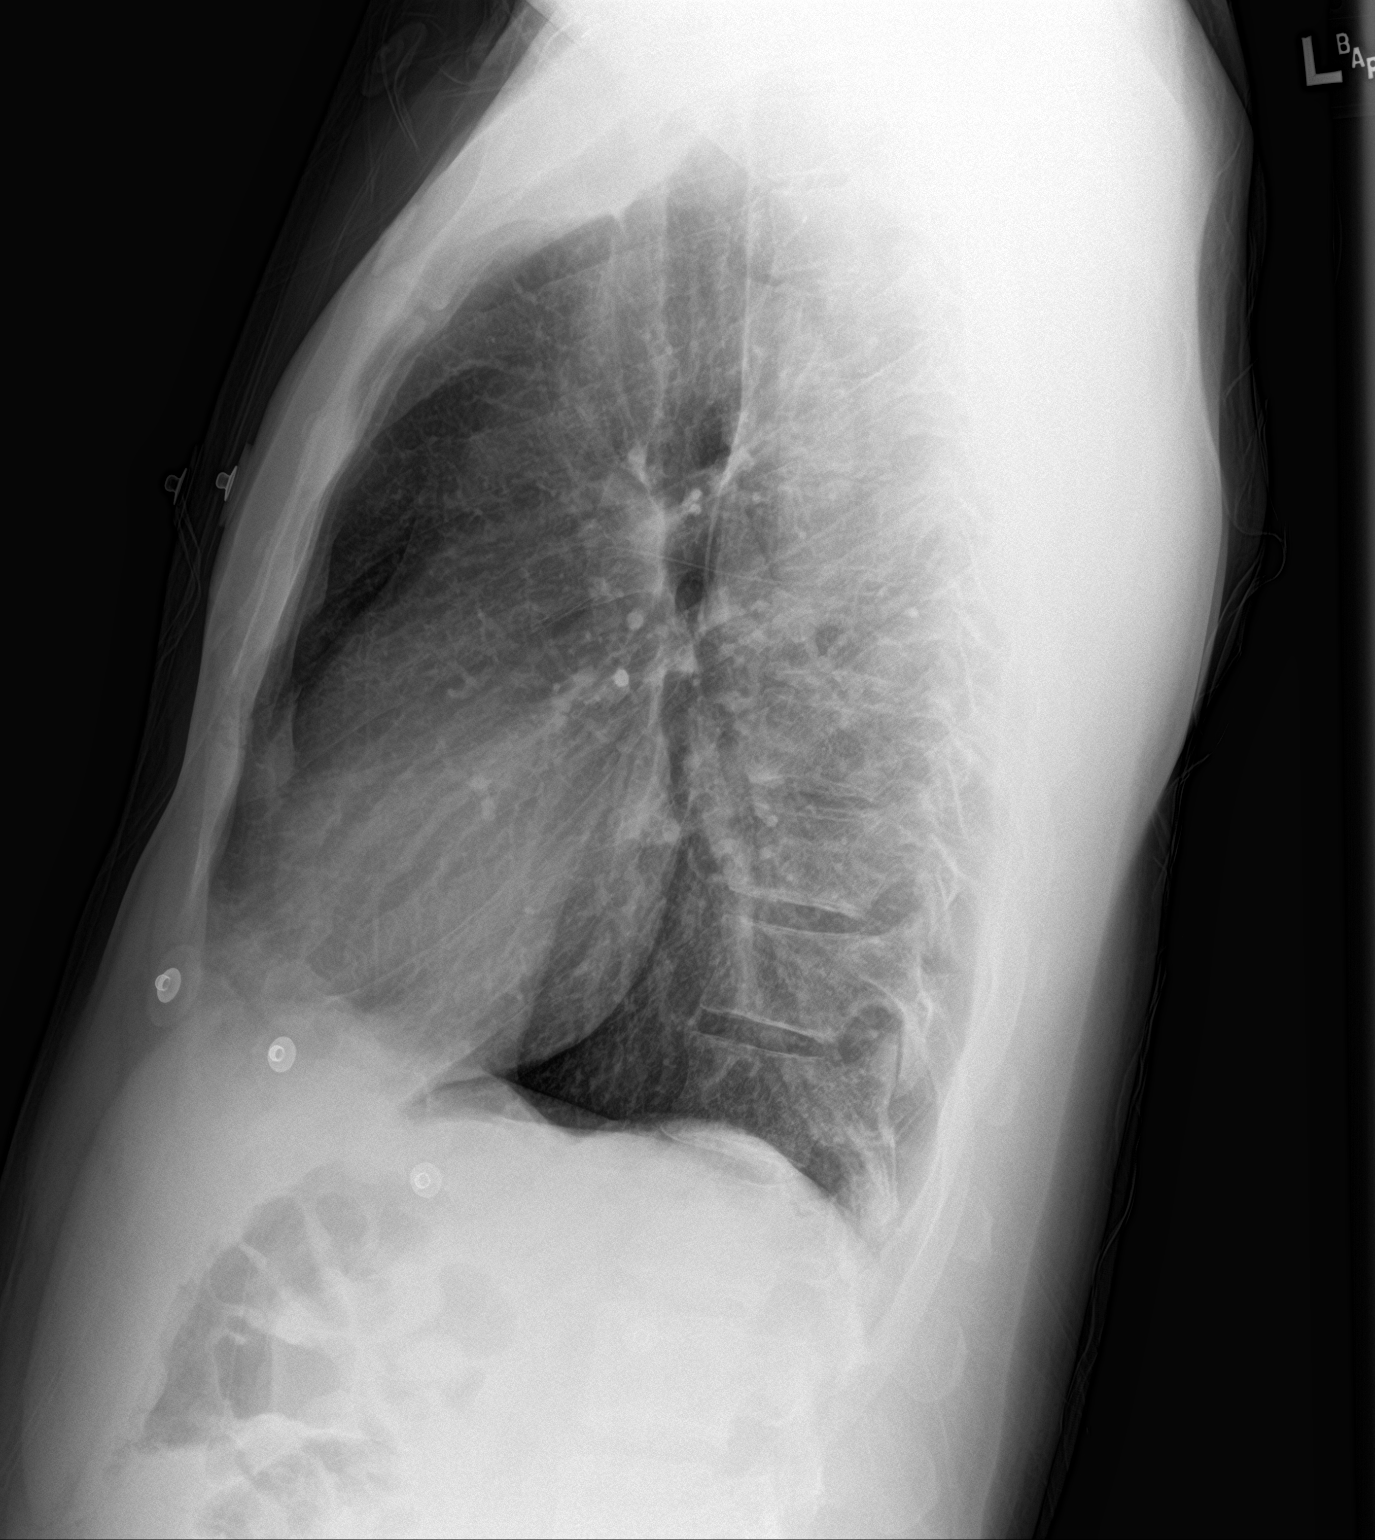

[2 of 2 positions shown; findings below may reference images not displayed]

FINDINGS: The heart size and mediastinal contours are within normal limits.
Both lungs are clear. The visualized skeletal structures are
unremarkable.
IMPRESSION: No active cardiopulmonary disease.

## 2024-09-21 ENCOUNTER — Emergency Department (HOSPITAL_COMMUNITY): Payer: Self-pay

## 2024-09-21 ENCOUNTER — Other Ambulatory Visit: Payer: Self-pay

## 2024-09-21 ENCOUNTER — Emergency Department (HOSPITAL_COMMUNITY)
Admission: EM | Admit: 2024-09-21 | Discharge: 2024-09-21 | Disposition: A | Discharge status: Home / Self Care | Payer: Self-pay | Attending: Emergency Medicine | Admitting: Emergency Medicine

## 2024-09-21 ENCOUNTER — Other Ambulatory Visit (HOSPITAL_COMMUNITY): Payer: Self-pay

## 2024-09-21 ENCOUNTER — Encounter (HOSPITAL_COMMUNITY): Payer: Self-pay | Admitting: Emergency Medicine

## 2024-09-21 DIAGNOSIS — J181 Lobar pneumonia, unspecified organism: Secondary | ICD-10-CM | POA: Insufficient documentation

## 2024-09-21 DIAGNOSIS — J189 Pneumonia, unspecified organism: Secondary | ICD-10-CM

## 2024-09-21 DIAGNOSIS — D72829 Elevated white blood cell count, unspecified: Secondary | ICD-10-CM | POA: Insufficient documentation

## 2024-09-21 LAB — COMPREHENSIVE METABOLIC PANEL WITH GFR
ALT: 25 U/L (ref 0–44)
AST: 40 U/L (ref 15–41)
Albumin: 4.3 g/dL (ref 3.5–5.0)
Alkaline Phosphatase: 55 U/L (ref 38–126)
Anion gap: 8 (ref 5–15)
BUN: 13 mg/dL (ref 6–20)
CO2: 24 mmol/L (ref 22–32)
Calcium: 9 mg/dL (ref 8.9–10.3)
Chloride: 105 mmol/L (ref 98–111)
Creatinine, Ser: 0.93 mg/dL (ref 0.61–1.24)
GFR, Estimated: >60 mL/min (ref >60)
Glucose, Bld: 99 mg/dL (ref 70–99)
Potassium: 4.1 mmol/L (ref 3.5–5.1)
Sodium: 137 mmol/L (ref 135–145)
Total Bilirubin: 0.7 mg/dL (ref 0.0–1.2)
Total Protein: 7.6 g/dL (ref 6.5–8.1)

## 2024-09-21 LAB — CBC WITH DIFFERENTIAL/PLATELET
Abs Immature Granulocytes: 0.08 K/uL — ABNORMAL HIGH (ref 0.00–0.07)
Basophils Absolute: 0 K/uL (ref 0.0–0.1)
Basophils Relative: 0 %
Eosinophils Absolute: 0 K/uL (ref 0.0–0.5)
Eosinophils Relative: 0 %
HCT: 40.6 % (ref 39.0–52.0)
Hemoglobin: 13.5 g/dL (ref 13.0–17.0)
Immature Granulocytes: 1 %
Lymphocytes Relative: 8 %
Lymphs Abs: 1.2 K/uL (ref 0.7–4.0)
MCH: 31.9 pg (ref 26.0–34.0)
MCHC: 33.3 g/dL (ref 30.0–36.0)
MCV: 96 fL (ref 80.0–100.0)
Monocytes Absolute: 0.7 K/uL (ref 0.1–1.0)
Monocytes Relative: 5 %
Neutro Abs: 12.2 K/uL — ABNORMAL HIGH (ref 1.7–7.7)
Neutrophils Relative %: 86 %
Platelets: 321 K/uL (ref 150–400)
RBC: 4.23 MIL/uL (ref 4.22–5.81)
RDW: 15 % (ref 11.5–15.5)
WBC: 14.2 K/uL — ABNORMAL HIGH (ref 4.0–10.5)
nRBC: 0 % (ref 0.0–0.2)

## 2024-09-21 LAB — URINALYSIS, ROUTINE W REFLEX MICROSCOPIC
Bilirubin Urine: NEGATIVE
Glucose, UA: NEGATIVE mg/dL
Hgb urine dipstick: NEGATIVE
Ketones, ur: NEGATIVE mg/dL
Leukocytes,Ua: NEGATIVE
Nitrite: NEGATIVE
Protein, ur: NEGATIVE mg/dL
Specific Gravity, Urine: 1.02 (ref 1.005–1.030)
pH: 6.5 (ref 5.0–8.0)

## 2024-09-21 LAB — LIPASE, BLOOD: Lipase: 30 U/L (ref 11–51)

## 2024-09-21 MED ORDER — AMOXICILLIN-POT CLAVULANATE 875-125 MG PO TABS
1.0000 | ORAL_TABLET | Freq: Once | ORAL | Status: AC
  Administered 2024-09-21: 14:00:00 1 via ORAL
  Filled 2024-09-21: qty 1

## 2024-09-21 MED ORDER — AMOXICILLIN-POT CLAVULANATE 875-125 MG PO TABS
1.0000 | ORAL_TABLET | Freq: Two times a day (BID) | ORAL | 0 refills | Status: AC
  Filled 2024-09-21: qty 10, 5d supply, fill #0

## 2024-09-21 MED ORDER — IOHEXOL 350 MG/ML SOLN
75.0000 mL | Freq: Once | INTRAVENOUS | Status: AC | PRN
  Administered 2024-09-21: 12:00:00 75 mL via INTRAVENOUS

## 2024-09-21 NOTE — ED Triage Notes (Addendum)
 [facility]/o an episode of n/v this morning, and diarrhea x 3 days.  Patient c/o abdominal pain x months.  Patient also endorses cough.  Patient reports chills/fever.  Patient reports 1 episode of emesis had blood in it this morning.  VSS per EMS.   18 L forearm 200 mL NACL

## 2024-09-21 NOTE — ED Notes (Signed)
 Pt called for CT x3 no response

## 2024-09-21 NOTE — ED Provider Notes (Signed)
 [place] EMERGENCY DEPARTMENT AT [facility] Provider Note   CSN: [id number] Arrival date & time: 09/21/24  9349     Patient presents with: Cough, Nausea, and Diarrhea   Austin Doyle is a 43 y.o. male.  43 year old male presents ED with complaints of     Prior to Admission medications  Medication Sig Start Date End Date Taking? Authorizing Provider  divalproex  (DEPAKOTE ) 500 MG DR tablet Take 1 tablet (500 mg total) by mouth every 12 (twelve) hours. 06/21/18   Ezzard Staci SAILOR, NP  gabapentin  (NEURONTIN ) 300 MG capsule Take 1 capsule (300 mg total) by mouth 3 (three) times daily. 06/21/18   Ezzard Staci SAILOR, NP  QUEtiapine  (SEROQUEL ) 200 MG tablet Take 1 tablet (200 mg total) by mouth at bedtime. 06/21/18   Ezzard Staci SAILOR, NP  ramelteon  (ROZEREM ) 8 MG tablet Take 1 tablet (8 mg total) by mouth at bedtime. 06/21/18   Ezzard Staci SAILOR, NP    Allergies: Patient has no known allergies.    Review of Systems  Respiratory:  Positive for cough.   Gastrointestinal:  Positive for diarrhea.    Updated Vital Signs BP 103/81   Pulse 64   Temp 98.7 F (37.1 C) (Oral)   Resp 14   Wt 77 kg   SpO2 98%   BMI 23.02 kg/m   Physical Exam  (all labs ordered are listed, but only abnormal results are displayed) Labs Reviewed  CBC WITH DIFFERENTIAL/PLATELET - Abnormal; Notable for the following components:      Result Value   WBC 14.2 (*)    Neutro Abs 12.2 (*)    Abs Immature Granulocytes 0.08 (*)    All other components within normal limits  COMPREHENSIVE METABOLIC PANEL WITH GFR  LIPASE, BLOOD  URINALYSIS, ROUTINE W REFLEX MICROSCOPIC    EKG: None  Radiology: CT ABDOMEN PELVIS W CONTRAST Result Date: 09/21/2024 CLINICAL DATA:  Abdominal pain with nausea and vomiting as well as diarrhea 5 days. EXAM: CT ABDOMEN AND PELVIS WITH CONTRAST TECHNIQUE: Multidetector CT imaging of the abdomen and pelvis was performed using the standard protocol following bolus administration  of intravenous contrast. RADIATION DOSE REDUCTION: This exam was performed according to the departmental dose-optimization program which includes automated exposure control, adjustment of the mA and/or kV according to patient size and/or use of iterative reconstruction technique. CONTRAST:  75mL OMNIPAQUE  IOHEXOL  350 MG/ML SOLN COMPARISON:  None Available. FINDINGS: Lower chest: Heart is normal size.  Visualized lung bases are clear. Hepatobiliary: Subcentimeter hypodensity over the left lobe of the liver too small to characterize but likely cysts. Liver, gallbladder and biliary tree are otherwise unremarkable. Pancreas: Normal. Spleen: Normal. Adrenals/Urinary Tract: Adrenal glands are normal. Kidneys are normal in size without hydronephrosis or nephrolithiasis. Ureters and bladder are normal. Stomach/Bowel: Stomach and small bowel are normal. Appendix is normal. Mild fecal retention throughout the colon which is otherwise unremarkable. Vascular/Lymphatic: Abdominal aorta is normal in caliber. Remaining vascular structures are unremarkable. No adenopathy. Reproductive: Prostate is unremarkable. Other: No free fluid or focal inflammatory change. Musculoskeletal: No focal abnormality. IMPRESSION: 1. No acute findings in the abdomen/pelvis. 2. Subcentimeter hypodensity over the left lobe of the liver too small to characterize, but likely a cyst. Electronically Signed   By: Toribio Agreste M.D.   On: 09/21/2024 12:32   DG Chest 1 View Result Date: 09/21/2024 EXAM: 1 VIEW(S) XRAY OF THE CHEST 09/21/2024 07:19:00 AM COMPARISON: 01/20/18 CLINICAL HISTORY: SOB (shortness of breath) FINDINGS: LUNGS AND PLEURA: Patchy right  mid lung opacities. No pleural effusion. No pneumothorax. HEART AND MEDIASTINUM: No acute abnormality of the cardiac and mediastinal silhouettes. BONES AND SOFT TISSUES: No acute osseous abnormality. IMPRESSION: 1. Right mid lung pneumonia. Electronically signed by: Waddell Calk MD 09/21/2024 07:39 AM  EST RP Workstation: [id number]    {Document cardiac monitor, telemetry assessment procedure when appropriate:32947} Procedures   Medications Ordered in the ED  iohexol  (OMNIPAQUE ) 350 MG/ML injection 75 mL (75 mLs Intravenous Contrast Given 09/21/24 1154)      {Click here for ABCD2, HEART and other calculators REFRESH Note before signing:1}                              Medical Decision Making Amount and/or Complexity of Data Reviewed Labs: ordered. Radiology: ordered.  Risk Prescription drug management.   ***  {Document critical care time when appropriate  Document review of labs and clinical decision tools ie CHADS2VASC2, etc  Document your independent review of radiology images and any outside records  Document your discussion with family members, caretakers and with consultants  Document social determinants of health affecting pt's care  Document your decision making why or why not admission, treatments were needed:32947:::1}   Final diagnoses:  None    ED Discharge Orders     None

## 2024-09-21 NOTE — Discharge Instructions (Addendum)
 Imaging was concerning for pneumonia.  You have been started on antibiotics in the ED.  The rest of your antibiotics have been sent to the pharmacy here at [facility].  Please take them twice a day for 5 days.  If you experience worsening shortness of breath, chest pain, dizziness, fainting, or any other worsening or new concerning symptoms please return to ED for further evaluation.

## 2024-09-21 NOTE — ED Provider Triage Note (Signed)
 Emergency Medicine Provider Triage Evaluation Note  Austin Doyle , a 42 y.o. male  was evaluated in triage.  Pt complains of abdominal pain, nausea, vomiting, diarrhea for approximately 5 days now.  Patient endorses intermittent abdominal pain and shortness of breath for several months now.    Review of Systems  Positive: Abdominal pain, nausea, vomiting, diarrhea, shortness of breath Negative: Chest pain, dizziness, syncope, weakness  Physical Exam  BP (!) 140/93   Pulse 92   Temp 100.1 F (37.8 C) (Oral)   Resp 18   Wt 77 kg   SpO2 100%   BMI 23.02 kg/m  Gen:   Awake, no distress   Resp:  Normal effort, lungs are clear to auscultation all fields MSK:   Moves extremities without difficulty, no lower extremity edema noted Other:    Medical Decision Making  Medically screening exam initiated at 7:03 AM.  Appropriate orders placed.  Austin Doyle was informed that the remainder of the evaluation will be completed by another provider, this initial triage assessment does not replace that evaluation, and the importance of remaining in the ED until their evaluation is complete.  Patient has had intermittent abdominal pain and shortness of breath for several months now.  Patient is mainly concerned of nausea vomiting and diarrhea that has recently occurred.  Patient reports several episodes of diarrhea over the last couple days and 2 episodes of emesis.  Patient does not appear to be in any acute distress and is nontoxic-appearing.  Patient is speaking in full sentences and has pain to palpation throughout abdomen on exam.   Myriam Fonda RAMAN, PA-C 09/21/24 0710
